# Patient Record
Sex: Male | Born: 1995 | Race: Black or African American | Hispanic: No | Marital: Single | Smoking: Never smoker
Health system: Southern US, Community
[De-identification: ages and names within clinical notes are randomized; demographics above are authoritative.]

## PROBLEM LIST (undated history)

## (undated) DIAGNOSIS — A6000 Herpesviral infection of urogenital system, unspecified: Secondary | ICD-10-CM

## (undated) DIAGNOSIS — Z9109 Other allergy status, other than to drugs and biological substances: Secondary | ICD-10-CM

## (undated) DIAGNOSIS — F988 Other specified behavioral and emotional disorders with onset usually occurring in childhood and adolescence: Secondary | ICD-10-CM

## (undated) DIAGNOSIS — I480 Paroxysmal atrial fibrillation: Secondary | ICD-10-CM

## (undated) HISTORY — DX: Other specified behavioral and emotional disorders with onset usually occurring in childhood and adolescence: F98.8

## (undated) HISTORY — DX: Paroxysmal atrial fibrillation: I48.0

## (undated) HISTORY — DX: Other allergy status, other than to drugs and biological substances: Z91.09

## (undated) HISTORY — DX: Herpesviral infection of urogenital system, unspecified: A60.00

---
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
## 2015-01-31 ENCOUNTER — Encounter: Payer: Self-pay | Admitting: Family Medicine

## 2015-01-31 ENCOUNTER — Ambulatory Visit (INDEPENDENT_AMBULATORY_CARE_PROVIDER_SITE_OTHER): Payer: Medicaid Other | Admitting: Family Medicine

## 2015-01-31 VITALS — BP 118/67 | HR 70 | Temp 97.9°F | Ht 72.5 in | Wt 142.0 lb

## 2015-01-31 DIAGNOSIS — L739 Follicular disorder, unspecified: Secondary | ICD-10-CM

## 2015-01-31 DIAGNOSIS — N4889 Other specified disorders of penis: Secondary | ICD-10-CM

## 2015-01-31 DIAGNOSIS — Z0000 Encounter for general adult medical examination without abnormal findings: Secondary | ICD-10-CM | POA: Insufficient documentation

## 2015-01-31 DIAGNOSIS — N489 Disorder of penis, unspecified: Secondary | ICD-10-CM

## 2015-01-31 NOTE — Assessment & Plan Note (Signed)
Patient is up to date on vaccines.  Will need TDaP in 2 years.  Declines STD testing currently  Dicussed exercise (plays basketball regularly), eating healthy, avoiding EtOH, tobacco, and drugs, and safe sex. F/u in 1 year or sooner as needed.

## 2015-01-31 NOTE — Progress Notes (Signed)
Patient ID: Aaron Lutz, male   DOB: 11/28/1995, 19 y.o.   MRN: [id number]    CC: annual exam HPI: Patient is a 19 y.o. male with a past medical history of murmur, allergic rhinitis presenting to clinic today for a physical exam.  Acute Concerns: Occasional dry rash noted in the pubic hair x 2 months. Flesh colored small bumps. No pain or drainage at the site. Occasionally pruritic. No fevers. No dysuria or penile discharge.  per the patient's report, he denies any lesions of the penile shaft or glans Has tried alcohol and neosporin with occasional improvement.  Had unprotected 5 months ago. Has only had 1 partner in the past. No concerns for STDs. Per his report, has only had 1 sexual partner in the past.      Past Medical History Patient Active Problem List   Diagnosis Date Noted  . Health care maintenance 01/31/2015  . Systolic murmur 08/27/2014  . Palpitations 07/27/2014  . Lesion of penis 07/27/2014  . Risky sexual behavior 06/09/2013  . Allergic rhinitis 05/17/2013  . Skin irritation 01/08/2012    Medications- reviewed and updated Current Outpatient Prescriptions  Medication Sig Dispense Refill  . cetirizine (ZYRTEC) 10 MG tablet Take 1 tablet (10 mg total) by mouth daily as needed for allergies. 30 tablet 2  . fluticasone (FLONASE) 50 MCG/ACT nasal spray Place 2 sprays into both nostrils daily. (Patient not taking: Reported on 01/31/2015) 16 g 2  . PATADAY 0.2 % SOLN One drop to each eye daily. (Patient not taking: Reported on 01/31/2015) 2.5 mL 2   No current facility-administered medications for this visit.    Immunization:  Tdap/TD: 2008   Influenza: 02/2015  Cancer Screening:  Colonoscopy: N/A    Social:  History   Social History  . Marital Status: Single    Spouse Name: N/A  . Number of Children: N/A  . Years of Education: N/A   Social History Main Topics  . Smoking status: Never Smoker   . Smokeless tobacco: Not on file  . Alcohol Use: No  . Drug Use: No    . Sexual Activity: Yes   Other Topics Concern  . None   Social History Narrative   Patient states he is only been sexually active with one person that was approximately 5 months ago, however from reviewing the EMR it appears that he was exposed to STDs back in 2012. 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 Hypertension Maternal Grandmother   . Diabetes Maternal Grandmother     ROS: All other systems reviewed and are negative.  Office vital signs reviewed. BP 118/67 mmHg  Pulse 70  Temp(Src) 97.9 F (36.6 C) (Oral)  Ht 6' 0.5" (1.842 m)  Wt 142 lb (64.411 kg)  BMI 18.98 kg/m2   Physical Examination:  General: Awake, alert, well- nourished, NAD ENMT:  TMs intact, normal light reflex, no erythema, no bulging. Nasal turbinates moist. MMM, Oropharynx clear without erythema or tonsillar exudate/hypertrophy Eyes: Conjunctiva non-injected. PERRL.  Cardio:  RRR, no m/r/g noted.  Pulm: No increased WOB.  CTAB, without wheezes, rhonchi or crackles noted.  GI: soft, NT/ND,+BS x4, no hepatomegaly, no splenomegaly GU: performed with male chaperone. No papules, lesions, or erythema noted. Pt points to hair follicle however still no lesions were noted with light.   Patient did not want me to examine his penis, but denies any lesions, drainage. MSK: Normal bulk and tone. Normal gait and station Skin: dry, intact, no rashes or lesions other than above. Neuro: Strength and sensation grossly intact, DTRs 1/4  Assessment/Plan:  Lesion of penis Was not able to examine the penile  shaft/glans. Per the patient, no lesions currently. If new concerns arise, will refer back to urology given concerns for Bowmen's disease on previous evaluation.  Health care maintenance Patient is up to date on vaccines.  Will need TDaP in 2 years.  Declines STD testing currently  Dicussed exercise (plays basketball regularly), eating healthy, avoiding EtOH, tobacco, and drugs, and safe sex. F/u in 1 year or sooner as needed.     No orders of the defined types were placed in this encounter.    No orders of the defined types were placed in this encounter.    Joanna Puffrystal S. Dorsey PGY-2, [facility]

## 2015-01-31 NOTE — Assessment & Plan Note (Addendum)
Was not able to examine the penile shaft/glans. Per the patient, no lesions currently. If new concerns arise, will refer back to urology given concerns for Bowmen's disease on previous evaluation.

## 2015-01-31 NOTE — Patient Instructions (Addendum)
Preventive Care for Adults A healthy lifestyle and preventive care can promote health and wellness. Preventive health guidelines for men include the following key practices:  A routine yearly physical is a good way to check with your health care provider about your health and preventative screening. It is a chance to share any concerns and updates on your health and to receive a thorough exam.  Visit your dentist for a routine exam and preventative care every 6 months. Brush your teeth twice a day and floss once a day. Good oral hygiene prevents tooth decay and gum disease.  The frequency of eye exams is based on your age, health, family medical history, use of contact lenses, and other factors. Follow your health care provider's recommendations for frequency of eye exams.  Eat a healthy diet. Foods such as vegetables, fruits, whole grains, low-fat dairy products, and lean protein foods contain the nutrients you need without too many calories. Decrease your intake of foods high in solid fats, added sugars, and salt. Eat the right amount of calories for you.Get information about a proper diet from your health care provider, if necessary.  Regular physical exercise is one of the most important things you can do for your health. Most adults should get at least 150 minutes of moderate-intensity exercise (any activity that increases your heart rate and causes you to sweat) each week. In addition, most adults need muscle-strengthening exercises on 2 or more days a week.  Maintain a healthy weight. The body mass index (BMI) is a screening tool to identify possible weight problems. It provides an estimate of body fat based on height and weight. Your health care provider can find your BMI and can help you achieve or maintain a healthy weight.For adults 20 years and older:  A BMI below 18.5 is considered underweight.  A BMI of 18.5 to 24.9 is normal.  A BMI of 25 to 29.9 is considered overweight.  A  BMI of 30 and above is considered obese.  Maintain normal blood lipids and cholesterol levels by exercising and minimizing your intake of saturated fat. Eat a balanced diet with plenty of fruit and vegetables. Blood tests for lipids and cholesterol should begin at age 26 and be repeated every 5 years. If your lipid or cholesterol levels are high, you are over 50, or you are at high risk for heart disease, you may need your cholesterol levels checked more frequently.Ongoing high lipid and cholesterol levels should be treated with medicines if diet and exercise are not working.  Avoid use of street drugs. Do not share needles with anyone. Ask for help if you need support or instructions about stopping the use of drugs.  High blood pressure causes heart disease and increases the risk of stroke. Your blood pressure should be checked at least every 1-2 years. Ongoing high blood pressure should be treated with medicines, if weight loss and exercise are not effective.  Practice safe sex. Use condoms and avoid high-risk sexual practices to reduce the spread of sexually transmitted infections (STIs). STIs include gonorrhea, chlamydia, syphilis, trichomonas, herpes, HPV, and human immunodeficiency virus (HIV). Herpes, HIV, and HPV are viral illnesses that have no cure. They can result in disability, cancer, and death.  If you are at risk of being infected with HIV, it is recommended that you take a prescription medicine daily to prevent HIV infection. This is called preexposure prophylaxis (PrEP). You are considered at risk if:  You are a man who has sex with  other men (MSM) and have other risk factors.  You are a heterosexual man, are sexually active, and are at increased risk for HIV infection.  You take drugs by injection.  You are sexually active with a partner who has HIV.  Talk with your health care provider about whether you are at high risk of being infected with HIV. If you choose to begin PrEP,  you should first be tested for HIV. You should then be tested every 3 months for as long as you are taking PrEP.  Testicular cancer screening is not recommended for adult males who have no symptoms. Screening includes self-exam, a health care provider exam, and other screening tests. Consult with your health care provider about any symptoms you have or any concerns you have about testicular cancer.  Use sunscreen. Apply sunscreen liberally and repeatedly throughout the day. You should seek shade when your shadow is shorter than you. Protect yourself by wearing long sleeves, pants, a wide-brimmed hat, and sunglasses year round, whenever you are outdoors.  Once a month, do a whole-body skin exam, using a mirror to look at the skin on your back. Tell your health care provider about new moles, moles that have irregular borders, moles that are larger than a pencil eraser, or moles that have changed in shape or color.  Stay current with required vaccines (immunizations).  Influenza vaccine. All adults should be immunized every year.  Tetanus, diphtheria, and acellular pertussis (Td, Tdap) vaccine. An adult who has not previously received Tdap or who does not know his vaccine status should receive 1 dose of Tdap. This initial dose should be followed by tetanus and diphtheria toxoids (Td) booster doses every 10 years. Adults with an unknown or incomplete history of completing a 3-dose immunization series with Td-containing vaccines should begin or complete a primary immunization series including a Tdap dose. Adults should receive a Td booster every 10 years.  Varicella vaccine. An adult without evidence of immunity to varicella should receive 2 doses or a second dose if he has previously received 1 dose.  Human papillomavirus (HPV) vaccine. Males aged 64-21 years who have not received the vaccine previously should receive the 3-dose series. Males aged 22-26 years may be immunized. Immunization is recommended  through the age of 14 years for any male who has sex with males and did not get any or all doses earlier. Immunization is recommended for any person with an immunocompromised condition through the age of 39 years if he did not get any or all doses earlier. During the 3-dose series, the second dose should be obtained 4-8 weeks after the first dose. The third dose should be obtained 24 weeks after the first dose and 16 weeks after the second dose.  Zoster vaccine. One dose is recommended for adults aged 72 years or older unless certain conditions are present.  Measles, mumps, and rubella (MMR) vaccine. Adults born before [birth year] generally are considered immune to measles and mumps. Adults born in [birth year] or later should have 1 or more doses of MMR vaccine unless there is a contraindication to the vaccine or there is laboratory evidence of immunity to each of the three diseases. A routine second dose of MMR vaccine should be obtained at least 28 days after the first dose for students attending postsecondary schools, health care workers, or international travelers. People who received inactivated measles vaccine or an unknown type of measles vaccine during 1963-1967 should receive 2 doses of MMR vaccine. People who received inactivated mumps  vaccine or an unknown type of mumps vaccine before 1979 and are at high risk for mumps infection should consider immunization with 2 doses of MMR vaccine. Unvaccinated health care workers born before [birth year] who lack laboratory evidence of measles, mumps, or rubella immunity or laboratory confirmation of disease should consider measles and mumps immunization with 2 doses of MMR vaccine or rubella immunization with 1 dose of MMR vaccine.  Pneumococcal 13-valent conjugate (PCV13) vaccine. When indicated, a person who is uncertain of his immunization history and has no record of immunization should receive the PCV13 vaccine. An adult aged 21 years or older who has certain medical  conditions and has not been previously immunized should receive 1 dose of PCV13 vaccine. This PCV13 should be followed with a dose of pneumococcal polysaccharide (PPSV23) vaccine. The PPSV23 vaccine dose should be obtained at least 8 weeks after the dose of PCV13 vaccine. An adult aged 78 years or older who has certain medical conditions and previously received 1 or more doses of PPSV23 vaccine should receive 1 dose of PCV13. The PCV13 vaccine dose should be obtained 1 or more years after the last PPSV23 vaccine dose.  Pneumococcal polysaccharide (PPSV23) vaccine. When PCV13 is also indicated, PCV13 should be obtained first. All adults aged 65 years and older should be immunized. An adult younger than age 52 years who has certain medical conditions should be immunized. Any person who resides in a nursing home or long-term care facility should be immunized. An adult smoker should be immunized. People with an immunocompromised condition and certain other conditions should receive both PCV13 and PPSV23 vaccines. People with human immunodeficiency virus (HIV) infection should be immunized as soon as possible after diagnosis. Immunization during chemotherapy or radiation therapy should be avoided. Routine use of PPSV23 vaccine is not recommended for American Indians, [place] Natives, or people younger than 65 years unless there are medical conditions that require PPSV23 vaccine. When indicated, people who have unknown immunization and have no record of immunization should receive PPSV23 vaccine. One-time revaccination 5 years after the first dose of PPSV23 is recommended for people aged 19-64 years who have chronic kidney failure, nephrotic syndrome, asplenia, or immunocompromised conditions. People who received 1-2 doses of PPSV23 before age 71 years should receive another dose of PPSV23 vaccine at age 28 years or later if at least 5 years have passed since the previous dose. Doses of PPSV23 are not needed for people  immunized with PPSV23 at or after age 13 years.  Meningococcal vaccine. Adults with asplenia or persistent complement component deficiencies should receive 2 doses of quadrivalent meningococcal conjugate (MenACWY-D) vaccine. The doses should be obtained at least 2 months apart. Microbiologists working with certain meningococcal bacteria, [employer] recruits, people at risk during an outbreak, and people who travel to or live in countries with a high rate of meningitis should be immunized. A first-year college student up through age 75 years who is living in a residence hall should receive a dose if he did not receive a dose on or after his 16th birthday. Adults who have certain high-risk conditions should receive one or more doses of vaccine.  Hepatitis A vaccine. Adults who wish to be protected from this disease, have certain high-risk conditions, work with hepatitis A-infected animals, work in hepatitis A research labs, or travel to or work in countries with a high rate of hepatitis A should be immunized. Adults who were previously unvaccinated and who anticipate close contact with an international adoptee during the first 70  days after arrival in the [place] from a country with a high rate of hepatitis A should be immunized.  Hepatitis B vaccine. Adults should be immunized if they wish to be protected from this disease, have certain high-risk conditions, may be exposed to blood or other infectious body fluids, are household contacts or sex partners of hepatitis B positive people, are clients or workers in certain care facilities, or travel to or work in countries with a high rate of hepatitis B.  Haemophilus influenzae type b (Hib) vaccine. A previously unvaccinated person with asplenia or sickle cell disease or having a scheduled splenectomy should receive 1 dose of Hib vaccine. Regardless of previous immunization, a recipient of a hematopoietic stem cell transplant should receive a 3-dose series  6-12 months after his successful transplant. Hib vaccine is not recommended for adults with HIV infection. Preventive Service / Frequency Ages 54 to 78  Blood pressure check.** / Every 1 to 2 years.  Lipid and cholesterol check.** / Every 5 years beginning at age 87.  Skin self-exam. / Monthly.  Influenza vaccine. / Every year.  Tetanus, diphtheria, and acellular pertussis (Tdap, Td) vaccine.** / Consult your health care provider. 1 dose of Td every 10 years.  Varicella vaccine.** / Consult your health care provider.  HPV vaccine. / 3 doses over 6 months, if 33 or younger.  Measles, mumps, rubella (MMR) vaccine.** / You need at least 1 dose of MMR if you were born in 1957 or later. You may also need a second dose.  Pneumococcal 13-valent conjugate (PCV13) vaccine.** / Consult your health care provider.  Pneumococcal polysaccharide (PPSV23) vaccine.** / 1 to 2 doses if you smoke cigarettes or if you have certain conditions.  Meningococcal vaccine.** / 1 dose if you are age 4 to 69 years and a Market researcher living in a residence hall, or have one of several medical conditions. You may also need additional booster doses.  Hepatitis A vaccine.** / Consult your health care provider.  Hepatitis B vaccine.** / Consult your health care provider.  Haemophilus influenzae type b (Hib) vaccine.** / Consult your health care provider.

## 2015-10-08 ENCOUNTER — Ambulatory Visit (INDEPENDENT_AMBULATORY_CARE_PROVIDER_SITE_OTHER): Payer: Medicaid Other | Admitting: Family Medicine

## 2015-10-08 VITALS — BP 122/70 | HR 88 | Temp 98.7°F | Wt 140.8 lb

## 2015-10-08 DIAGNOSIS — Z23 Encounter for immunization: Secondary | ICD-10-CM | POA: Diagnosis not present

## 2015-10-08 DIAGNOSIS — J358 Other chronic diseases of tonsils and adenoids: Secondary | ICD-10-CM | POA: Diagnosis not present

## 2015-10-08 NOTE — Assessment & Plan Note (Signed)
Consistent with bilateral extensive tonsilloliths, no sign of complication. Tonsillars appear normal otherwise.  Plan: 1. Reassurance, benign condition, may cause halitosis (does not seem to be his concern) 2. Per patient request attempted manual dislodge with long q-tips, unsuccessful, most stones seem to be deeply inside tonsillar crypts 3. Offered referral to ENT for evaluation, consider future tonsillectomy. Patient not interested today (again I advised against this but presented it as a future option).

## 2015-10-08 NOTE — Patient Instructions (Signed)
Thank you for coming in to clinic today.  1. You are exactly right, these are Tonsilloliths, you have several on both tonsils. There are no other abnormalities with your throat. - These are benign, not a serious problem but can be uncomfortable or a nuisance - Recommend continue with warm salt water gargles to loosen them up, you may try the long q-tips to help dislodge them later - if they keep coming back, cause bad breath, or cause tonsil swelling or recurrent sore throat/infections, then I would recommend at least discussing with an ENT doctor to see what options you have. They may recommend Tonsillectomy surgery in future, if you choose. But there is not a medical reason to do this surgery.  Please schedule a follow-up appointment with Dr Leonides Schanzorsey as needed  If you have any other questions or concerns, please feel free to call the clinic to contact me. You may also schedule an earlier appointment if necessary.  However, if your symptoms get significantly worse, please go to the Emergency Department to seek immediate medical attention.  Saralyn PilarAlexander Brigitt Mcclish, DO [facility]

## 2015-10-08 NOTE — Progress Notes (Signed)
   Subjective:    Patient ID: Aaron Lutz, male    DOB: 04/04/1996, 20 y.o.   MRN: [id number]  Aaron ChestnutJuwan Lutz is a 20 y.o. male presenting on 10/08/2015 for tonsil stones   Patient presents for a same day appointment.   HPI   TONSILLOLITHS, CHRONIC: - Reported a long history of this problem with "tonsil stones", but just realized what it was recently. Stated he had a discomfort in back of his throat more Right > Left about 5 days ago, he found what it was online. He tried salt water gargles with some relief and tried to dislodge some without success. No other treatments tried. In years past, he has noticed that he occasionally will cough up small "white pieces", and thought it was old food. - Admits to prior history of pharyngitis about 1-2x yearly, none recently - Denies any fevers/chills, sore throat, swelling, cough, bad breath, nausea, vomiting  Social History  Substance Use Topics  . Smoking status: Never Smoker   . Smokeless tobacco: Not on file  . Alcohol Use: No    Review of Systems Per HPI unless specifically indicated above     Objective:    BP 122/70 mmHg  Pulse 88  Temp(Src) 98.7 F (37.1 C) (Oral)  Wt 140 lb 12.8 oz (63.866 kg)  Wt Readings from Last 3 Encounters:  10/08/15 140 lb 12.8 oz (63.866 kg) (26 %*, Z = -0.63)  01/31/15 142 lb (64.411 kg) (32 %*, Z = -0.48)  12/19/14 146 lb (66.225 kg) (39 %*, Z = -0.27)   * Growth percentiles are based on CDC 2-20 Years data.    Physical Exam  Constitutional: He appears well-developed and well-nourished. No distress.  Well-appearing, comfortable, cooperative  HENT:  Oropharynx with clear and moist mucus mem. Significant with Bilateral Tonsils having several scattered firm white tonsilloliths, seem deeply embedded in crypts. Unable to dislodge with attempted q-tip. No tonsillar edema or erythema. No exudates. No ulcerations or other mucosal abnormalities.  Neck: Normal range of motion. Neck supple.  Lymphadenopathy:   He has no cervical adenopathy.  Neurological: He is alert.  Skin: Skin is warm and dry. He is not diaphoretic.  Nursing note and vitals reviewed.      Assessment & Plan:   Problem List Items Addressed This Visit    Tonsillolith - Primary    Consistent with bilateral extensive tonsilloliths, no sign of complication. Tonsillars appear normal otherwise.  Plan: 1. Reassurance, benign condition, may cause halitosis (does not seem to be his concern) 2. Per patient request attempted manual dislodge with long q-tips, unsuccessful, most stones seem to be deeply inside tonsillar crypts 3. Offered referral to ENT for evaluation, consider future tonsillectomy. Patient not interested today (again I advised against this but presented it as a future option).       Other Visit Diagnoses    Encounter for immunization        Relevant Orders    Flu Vaccine QUAD 36+ mos IM       No orders of the defined types were placed in this encounter.      Follow up plan: Return in about 3 months (around 01/08/2016) for Annual physical.  Saralyn PilarAlexander Kishan Wachsmuth, DO [facility], PGY-3

## 2015-10-09 ENCOUNTER — Encounter: Payer: Self-pay | Admitting: Family Medicine

## 2016-05-09 ENCOUNTER — Emergency Department (HOSPITAL_COMMUNITY)
Admission: EM | Admit: 2016-05-09 | Discharge: 2016-05-09 | Disposition: A | Discharge status: Home / Self Care | Payer: Medicaid Other | Attending: Emergency Medicine | Admitting: Emergency Medicine

## 2016-05-09 ENCOUNTER — Emergency Department (HOSPITAL_COMMUNITY): Payer: Medicaid Other

## 2016-05-09 ENCOUNTER — Encounter (HOSPITAL_COMMUNITY): Payer: Self-pay | Admitting: *Deleted

## 2016-05-09 DIAGNOSIS — I493 Ventricular premature depolarization: Secondary | ICD-10-CM

## 2016-05-09 DIAGNOSIS — R002 Palpitations: Secondary | ICD-10-CM

## 2016-05-09 DIAGNOSIS — Z79899 Other long term (current) drug therapy: Secondary | ICD-10-CM | POA: Diagnosis not present

## 2016-05-09 DIAGNOSIS — E876 Hypokalemia: Secondary | ICD-10-CM

## 2016-05-09 DIAGNOSIS — M419 Scoliosis, unspecified: Secondary | ICD-10-CM | POA: Diagnosis not present

## 2016-05-09 DIAGNOSIS — R0602 Shortness of breath: Secondary | ICD-10-CM

## 2016-05-09 DIAGNOSIS — R0789 Other chest pain: Secondary | ICD-10-CM

## 2016-05-09 LAB — CBC
HEMATOCRIT: 44.4 % (ref 39.0–52.0)
Hemoglobin: 15.5 g/dL (ref 13.0–17.0)
MCH: 29.9 pg (ref 26.0–34.0)
MCHC: 34.9 g/dL (ref 30.0–36.0)
MCV: 85.5 fL (ref 78.0–100.0)
PLATELETS: 387 10*3/uL (ref 150–400)
RBC: 5.19 MIL/uL (ref 4.22–5.81)
RDW: 12.1 % (ref 11.5–15.5)
WBC: 6 10*3/uL (ref 4.0–10.5)

## 2016-05-09 LAB — RAPID URINE DRUG SCREEN, HOSP PERFORMED
AMPHETAMINES: NOT DETECTED
BENZODIAZEPINES: NOT DETECTED
Barbiturates: NOT DETECTED
COCAINE: NOT DETECTED
OPIATES: NOT DETECTED
Tetrahydrocannabinol: NOT DETECTED

## 2016-05-09 LAB — BASIC METABOLIC PANEL
Anion gap: 11 (ref 5–15)
BUN: 7 mg/dL (ref 6–20)
CHLORIDE: 104 mmol/L (ref 101–111)
CO2: 24 mmol/L (ref 22–32)
CREATININE: 0.93 mg/dL (ref 0.61–1.24)
Calcium: 9.5 mg/dL (ref 8.9–10.3)
GFR calc non Af Amer: >60 mL/min (ref >60)
Glucose, Bld: 106 mg/dL — ABNORMAL HIGH (ref 65–99)
POTASSIUM: 3.3 mmol/L — AB (ref 3.5–5.1)
SODIUM: 139 mmol/L (ref 135–145)

## 2016-05-09 LAB — I-STAT TROPONIN, ED: Troponin i, poc: 0 ng/mL (ref 0.00–0.08)

## 2016-05-09 LAB — TSH: TSH: 1.919 u[IU]/mL (ref 0.350–4.500)

## 2016-05-09 LAB — MAGNESIUM: MAGNESIUM: 2.1 mg/dL (ref 1.7–2.4)

## 2016-05-09 MED ORDER — SODIUM CHLORIDE 0.9 % IV BOLUS (SEPSIS)
1000.0000 mL | Freq: Once | INTRAVENOUS | Status: AC
  Administered 2016-05-09: 1000 mL via INTRAVENOUS

## 2016-05-09 MED ORDER — POTASSIUM CHLORIDE CRYS ER 20 MEQ PO TBCR
40.0000 meq | EXTENDED_RELEASE_TABLET | Freq: Once | ORAL | Status: AC
  Administered 2016-05-09: 40 meq via ORAL
  Filled 2016-05-09: qty 2

## 2016-05-09 NOTE — Discharge Instructions (Signed)
Your palpitations are likely related to Premature Ventricular Contractions (PVCs). Your labs showed a mildly low potassium level, which was replenished here; see the list of foods below to help supplement your diet with potassium. Stay well hydrated. Avoid caffeine or energy drink intake, avoid over the counter decongestants such as Sudafed, etc. Your chest xray showed mild scoliosis of your spine, which is not a emergently concerning finding, you can follow up with your primary care doctor as needed for ongoing management of this finding. All your other labs were unremarkable and reassuring. Follow up with your cardiologist in the next 5-7 days for ongoing evaluation and management of your palpitations and symptoms. Return to the ER for changes or worsening symptoms.

## 2016-05-09 NOTE — ED Triage Notes (Signed)
Pt to ED by EMS c/o heart racing. Pt got up to get ready for work and felt like his heart was racing. Pt drove to his girlfriends house and felt L sided chest pain. EMS initial HR 130-170 in afib rvr. Enroute pt converted without intervention

## 2016-05-09 NOTE — ED Provider Notes (Signed)
[facility]EMER[facility]ENCY DEPT Provider Note   CSN: [id number] Arrival date & time: 05/09/16  4098     History   Chief Complaint Chief Complaint  Patient presents with  . Palpitations    HPI Aaron Lutz is a 20 y.o. male with a PMHx of palpitations, systolic murmur, and allergies, who presents to the ED with complaints of L chest pain and palpitations that began upon awakening around 5 AM. Patient states that he woke up and had some mild left-sided chest pressure and as he was getting ready he felt like his heart was "thumping hard and then skipping a beat, and then thumping hard again". It then became more of a fast constant heart beat, and caused the chest pressure to worsen, and he felt somewhat SOB. He called EMS and while awaiting transport, he prayed with his family; when EMS got there they advised him to sit back and try to "relax", and at that point his heart rate sporadically converted. Describes the chest pressure as 5/10 constant pressure-like pain across the left side of his chest, nonradiating, worse with irregular heartbeat, unchanged with exertion or inspiration, and improved with rest but no other treatments tried prior to arrival, and has completely resolved at this time. SOB also resolved after his heart rate returned to normal. EMS rhythm strip showed ?Afib with RVR and PVCs (see Haiku image below for strip). He states that the entire episode lasted ~1hr before self resolving. Arrived to ED in NSR.  He was seen by Dr. Tobias Alexander of [facility] last February 2016 for concerns of palpitations, had a normal echo aside from mild tricuspid regurg, and had a normal Holter monitor study done, and he has not followed back up with her again.  He states that last week he had some cold and flu symptoms, was taking Robitussin and NyQuil up until Wednesday night (3 days ago), but denies any other decongestant use. Denies any energy drinks or caffeine intake recently. Denies any illicit drug use. He is a  nonsmoker with no family history of cardiac disease. He denies any fevers, chills, ongoing chest pain or shortness of breath, lightheadedness, diaphoresis, leg swelling, recent travel/surgery/immobilization, family or personal history of DVT/PE, cough, abdominal pain, nausea, vomiting, diarrhea, constipation, dysuria, hematuria, numbness, tingling, focal weakness, claudication, orthopnea. Denies any other complaints at this time. All symptoms have resolved prior to arrival.   The history is provided by the patient and medical records. No language interpreter was used.  Palpitations   This is a recurrent problem. The current episode started 1 to 2 hours ago. The problem occurs constantly. The problem has been resolved. The problem is associated with an unknown factor. On average, each episode lasts 1 hour. Associated symptoms include chest pain (initially with palpitations but resolved), chest pressure, irregular heartbeat and shortness of breath (initially with palpitations, but resolved). Pertinent negatives include no diaphoresis, no fever, no numbness, no claudication, no near-syncope, no orthopnea, no PND, no abdominal pain, no nausea, no vomiting, no lower extremity edema, no weakness and no cough. He has tried deep relaxation for the symptoms. The treatment provided significant relief. Risk factors include being male. 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    Past Medical History:  Diagnosis Date  . Genital herpes   . Nail biting   . Pollen allergies     Patient Active Problem List   Diagnosis Date Noted  . Tonsillolith 10/08/2015  . Health care maintenance 01/31/2015  .  Systolic murmur 08/27/2014  . Palpitations 07/27/2014  . Lesion of penis 07/27/2014  . Risky sexual behavior 06/09/2013  . Allergic rhinitis 05/17/2013  . Skin irritation 01/08/2012    History reviewed. No pertinent surgical history.     Home  Medications    Prior to Admission medications   Medication Sig Start Date End Date Taking? Authorizing Provider  cetirizine (ZYRTEC) 10 MG tablet Take 1 tablet (10 mg total) by mouth daily as needed for allergies. 12/19/14   Latrelle DodrillBrittany J McIntyre, MD  fluticasone (FLONASE) 50 MCG/ACT nasal spray Place 2 sprays into both nostrils daily. Patient not taking: Reported on 01/31/2015 12/19/14   Latrelle DodrillBrittany J McIntyre, MD  PATADAY 0.2 % SOLN One drop to each eye daily. Patient not taking: Reported on 01/31/2015 12/19/14   Latrelle DodrillBrittany J McIntyre, MD    Family History Family History  Problem Relation Age of Onset  . Hypertension Maternal Grandmother   . Diabetes Maternal Grandmother     Social History Social History  Substance Use Topics  . Smoking status: Never Smoker  . Smokeless tobacco: Never Used  . Alcohol use No     Allergies   Pollen extract   Review of Systems Review of Systems  Constitutional: Negative for chills, diaphoresis and fever.  Respiratory: Positive for shortness of breath (initially with palpitations, but resolved). Negative for cough.   Cardiovascular: Positive for chest pain (initially with palpitations but resolved) and palpitations. Negative for orthopnea, claudication, leg swelling, PND and near-syncope.  Gastrointestinal: Negative for abdominal pain, constipation, diarrhea, nausea and vomiting.  Genitourinary: Negative for dysuria and hematuria.  Musculoskeletal: Negative for arthralgias and myalgias.  Skin: Negative for color change.  Allergic/Immunologic: Negative for immunocompromised state.  Neurological: Negative for weakness, light-headedness and numbness.  Psychiatric/Behavioral: Negative for confusion.   10 Systems reviewed and are negative for acute change except as noted in the HPI.   Physical Exam Updated Vital Signs BP 128/68   Pulse 84   Temp 97.9 F (36.6 C) (Oral)   Resp 18   SpO2 100%   Physical Exam  Constitutional: He is oriented to person,  place, and time. Vital signs are normal. He appears well-developed and well-nourished.  Non-toxic appearance. No distress.  Afebrile, nontoxic, NAD  HENT:  Head: Normocephalic and atraumatic.  Mouth/Throat: Oropharynx is clear and moist and mucous membranes are normal.  Eyes: Conjunctivae and EOM are normal. Right eye exhibits no discharge. Left eye exhibits no discharge.  Neck: Normal range of motion. Neck supple.  Cardiovascular: Normal rate, regular rhythm, normal heart sounds and intact distal pulses.  Exam reveals no gallop and no friction rub.   No murmur heard. RRR, nl s1/s2, no m/r/g, distal pulses intact, no pedal edema  Pulmonary/Chest: Effort normal and breath sounds normal. No respiratory distress. He has no decreased breath sounds. He has no wheezes. He has no rhonchi. He has no rales. He exhibits no tenderness, no crepitus, no deformity and no retraction.  CTAB in all lung fields, no w/r/r, no hypoxia or increased WOB, speaking in full sentences, SpO2 100% on RA  Chest wall nonTTP without crepitus, deformities, or retractions   Abdominal: Soft. Normal appearance and bowel sounds are normal. He exhibits no distension. There is no tenderness. There is no rigidity, no rebound, no guarding, no CVA tenderness, no tenderness at McBurney's point and negative Murphy's sign.  Musculoskeletal: Normal range of motion.  MAE x4 Strength and sensation grossly intact Distal pulses intact Gait steady No pedal edema, neg homan's  bilaterally   Neurological: He is alert and oriented to person, place, and time. He has normal strength. No sensory deficit.  Skin: Skin is warm, dry and intact. No rash noted.  Psychiatric: He has a normal mood and affect.  Nursing note and vitals reviewed.    ED Treatments / Results  Labs (all labs ordered are listed, but only abnormal results are displayed) Labs Reviewed  BASIC METABOLIC PANEL - Abnormal; Notable for the following:       Result Value    Potassium 3.3 (*)    Glucose, Bld 106 (*)    All other components within normal limits  CBC  MAGNESIUM  TSH  RAPID URINE DRUG SCREEN, HOSP PERFORMED  I-STAT TROPOININ, ED    EKG  EKG Interpretation  Date/Time:  Saturday May 09 2016 06:26:45 EDT Ventricular Rate:  83 PR Interval:    QRS Duration: 108 QT Interval:  359 QTC Calculation: 422 R Axis:   58 Text Interpretation:  Sinus rhythm Probable left atrial enlargement RSR' in V1 or V2, probably normal variant Minimal ST depression ST elev, probable normal early repol pattern No significant change since last tracing since 2016 Confirmed by [place],  DO, KRISTEN [phone]) on 05/09/2016 6:31:04 AM         Radiology Dg Chest 2 View  Result Date: 05/09/2016 CLINICAL DATA:  20 year old male with left upper chest tightness since early this morning. Heart racing. Shaking. Initial encounter. EXAM: CHEST  2 VIEW COMPARISON:  No comparison chest x-ray. Comparison the AP thoracic plain film exam 03/02/2008. FINDINGS: No infiltrate, congestive heart failure or pneumothorax. S shaped scoliosis thoracic and upper lumbar spine. Slight distortion of the mediastinal and cardiac silhouette which are within normal limits. IMPRESSION: Lungs are clear. S shaped scoliosis thoracic and upper lumbar spine. Slight distortion of the mediastinal and cardiac silhouette which are within normal limits. Electronically Signed   By: Lacy Duverney M.D.   On: 05/09/2016 08:10    Echo 08/2014 Study Conclusions: - Left ventricle: The cavity size was normal. Wall thickness was normal. Systolic function was vigorous. The estimated ejection fraction was in the range of 65% to 70%. Wall motion was normal; there were no regional wall motion abnormalities. Left ventricular diastolic function parameters were normal. - Left atrium: The atrium was normal in size. Impressions: - Normal study.  HOLTER MONITOR 08/2014: no arrhythmias  found   Procedures Procedures (including critical care time)  Medications Ordered in ED Medications  sodium chloride 0.9 % bolus 1,000 mL (0 mLs Intravenous Stopped 05/09/16 0846)  potassium chloride SA (K-DUR,KLOR-CON) CR tablet 40 mEq (40 mEq Oral Given 05/09/16 0740)     Initial Impression / Assessment and Plan / ED Course  I have reviewed the triage vital signs and the nursing notes.  Pertinent labs & imaging results that were available during my care of the patient were reviewed by me and considered in my medical decision making (see chart for details).  Clinical Course    20 y.o. male here with palpitations that occurred this morning after awakening, started out with CP, had some SOB initially when his heart was "racing" but that resolved entirely once his heart rate came down. Entire episode lasted about 1hr, and self-converted. Rhythm strip from EMS has ?Afib although some P waves are seen within the strip so difficult to determine if this is truly afib vs just rapid rate and PVCs. Arrives here in NSR and CP/SOB free. No longer having any symptoms. No reproducible CP  on exam. No tachycardia/hypoxia/LE swelling, and no RFs for PE, doubt this as a cause. Had a cold last week, but hasn't taken any meds for this since 3 days ago. No illicit drug use or caffeine/energy drink use. Has seen cardiology last year for palpitations, Echo nl and holter monitor without arrhythmias. EKG here unchanged from prior and without any acute ischemic findings. Will get CBC, BMP, trop, UDS, TSH, Mg, and CXR. Will give fluids and then reassess shortly. Doubt need for ASA or other pain meds given that symptoms have resolved. Will reassess shortly.   7:27 AM Trop WNL. CBC WNL. BMP with mildly low K 3.3, will replete here. Still awaiting TSH, Mg, UDS, and CXR. Will reassess shortly   8:48 AM TSH WNL. Mg WNL. UDS negative. CXR showing mild scoliosis but otherwise unremarkable. Pt feeling well, no ongoing  palpitations or recurrence of symptoms. Discussed that it seems likely that his symptoms were related to PVCs, I doubt Afib truly occurred but given the appearance of the rhythm strip from EMS, it's still possible. CHA2DS2-VASC score 0, doubt need for anticoagulation at this point. Discussed importance of staying hydrated, and avoidance of caffeine/stimulants to avoid any worsening of his palpitations. F/up with his cardiologist in 5-7 days for ongoing evaluation and management of his recurrent palpitations. F/up with PCP for management of his scoliosis if it becomes an issue, although this seems to be a minor incidental finding. Potassium supplementation given here, no need for further repletion. I explained the diagnosis and have given explicit precautions to return to the ER including for any other new or worsening symptoms. The patient understands and accepts the medical plan as it's been dictated and I have answered their questions. Discharge instructions concerning home care and prescriptions have been given. The patient is STABLE and is discharged to home in good condition.   This patients CHA2DS2-VASc Score and unadjusted Ischemic Stroke Rate (% per year) is equal to 0.2 % stroke rate/year from a score of 0 Above score calculated as 1 point each if present [CHF, HTN, DM, Vascular=MI/PAD/Aortic Plaque, Age if 65-74, or Male] Above score calculated as 2 points each if present [Age > 75, or Stroke/TIA/TE]    Final Clinical Impressions(s) / ED Diagnoses   Final diagnoses:  Palpitations  Hypokalemia  Left chest pressure  SOB (shortness of breath)  PVC (premature ventricular contraction)  Scoliosis of thoracolumbar spine, unspecified scoliosis type    New Prescriptions New Prescriptions   No medications on file     Aaron Stager Camprubi-Soms, PA-C 05/09/16 0857    Layla Maw Ward, DO 05/09/16 2322

## 2016-05-17 NOTE — Progress Notes (Addendum)
Cardiology Office Note   Date:  05/18/2016   ID:  Aaron ChestnutJuwan Headrick, DOB 07/01/1996, MRN [id number]  PCP:  Rodrigo Ranrystal Dorsey, MD  Cardiologist:  Dr. Delton SeeNelson    Chief Complaint  Patient presents with  . Hospitalization Follow-up      History of Present Illness: Aaron Lutz is a 20 y.o. male who presents for post hospitalization for a fib with RVR HR 130-170 associated with chest pain.  He converted prior to arrival to ER.  Chest pain resolved after he converted to SR.  He was in a fib for about 1 hour.    Labs were normal except the K+ was 3.3.  Troponin I 0.0. Drug screen neg.    Last seen by Dr. Delton SeeNelson in 08/2014 for palpitations.  Echo then  With normal echo with normal LV function, mild TR.  48 hr. holter revealed SB at 48, sinus tach at 143, normal holter..   Today.he has had no further episodes.  No further chest pain.  No SOB.  I reviewed strips with Dr. Ladona Ridgelaylor of his A fib.      Past Medical History:  Diagnosis Date  . Genital herpes   . Nail biting   . Pollen allergies     No past surgical history on file.   Current Outpatient Prescriptions  Medication Sig Dispense Refill  . cetirizine (ZYRTEC) 10 MG tablet Take 1 tablet (10 mg total) by mouth daily as needed for allergies. (Patient not taking: Reported on 05/18/2016) 30 tablet 2  . fluticasone (FLONASE) 50 MCG/ACT nasal spray Place 2 sprays into both nostrils daily. (Patient not taking: Reported on 05/18/2016) 16 g 2  . PATADAY 0.2 % SOLN One drop to each eye daily. (Patient not taking: Reported on 05/18/2016) 2.5 mL 2   No current facility-administered medications for this visit.     Allergies:   Pollen extract    Social History:  The patient  reports that he has never smoked. He has never used smokeless tobacco. He reports that he does not drink alcohol or use drugs.   Family History:  The patient's family history includes Diabetes in his maternal grandmother; Hypertension in his maternal grandmother.    ROS:   General:no colds or fevers, some weight loss since 2016. Skin:no rashes or ulcers HEENT:no blurred vision, no congestion CV:see HPI PUL:see HPI GI:no diarrhea constipation or melena, no indigestion GU:no hematuria, no dysuria MS:no joint pain, no claudication Neuro:no syncope, no lightheadedness Endo:no diabetes, no thyroid disease  Wt Readings from Last 3 Encounters:  05/18/16 138 lb (62.6 kg)  10/08/15 140 lb 12.8 oz (63.9 kg) (26 %, Z= -0.63)*  01/31/15 142 lb (64.4 kg) (32 %, Z= -0.48)*   * Growth percentiles are based on CDC 2-20 Years data.     PHYSICAL EXAM: VS:  BP 132/72   Pulse 66   Ht 6' 0.5" (1.842 m)   Wt 138 lb (62.6 kg)   BMI 18.46 kg/m  , BMI Body mass index is 18.46 kg/m. General:Pleasant affect, NAD Skin:Warm and dry, brisk capillary refill HEENT:normocephalic, sclera clear, mucus membranes moist Neck:supple, no JVD, no bruits  Heart:S1S2 RRR without murmur, gallup, rub or click Lungs:clear without rales, rhonchi, or wheezes XLK:GMWNAbd:soft, non tender, + BS, do not palpate liver spleen or masses Ext:no lower ext edema, 2+ pedal pulses, 2+ radial pulses Neuro:alert and oriented X 3, MAE, follows commands, + facial symmetry    EKG:  EKG is ordered today. The ekg ordered today demonstrates SR  to    Recent Labs: 05/09/2016: BUN 7; Creatinine, Ser 0.93; Hemoglobin 15.5; Magnesium 2.1; Platelets 387; Potassium 3.3; Sodium 139; TSH 1.919    Lipid Panel No results found for: CHOL, TRIG, HDL, CHOLHDL, VLDL, LDLCALC, LDLDIRECT     Other studies Reviewed: Additional studies/ records that were reviewed today include: . ECHO: Study Conclusions  - Left ventricle: The cavity size was normal. Wall thickness was normal. Systolic function was vigorous. The estimated ejection fraction was in the range of 65% to 70%. Wall motion was normal; there were no regional wall motion abnormalities. Left ventricular diastolic function parameters were normal. - Left  atrium: The atrium was normal in size.  Impressions:  - Normal study.  ASSESSMENT AND PLAN:  1. PAF with RVR converted spontaneously -it had slowed when EMS ran the strip and then he converted. Reviewed with Dr. Ladona Ridgelaylor will add cardizem CD 120 mg and have pt follow up with Dr. Elberta Fortisamnitz for possible ablation (sript of A fib is in ER provider note)  CHA2DS2VASc score = 0  2. Chest pain due to rapid HR.  No further episodes.   3.  Hypokalemia will recheck BMP today.  Instructed him to eat bananas and oranges but if remains low will place on low dose K+   Current medicines are reviewed with the patient today.  The patient Has no concerns regarding medicines.  The following changes have been made:  See above Labs/ tests ordered today include:see above  Disposition:   FU:  see above  Signed, Nada BoozerLaura Skyah Hannon, NP  05/18/2016 10:53 AM    [facility] [address], [place], [place]  [address]/ [address] [place], [place] Phone: [phone]; Fax: [phone]  [phone]

## 2016-05-18 ENCOUNTER — Ambulatory Visit (INDEPENDENT_AMBULATORY_CARE_PROVIDER_SITE_OTHER): Payer: Medicaid Other | Admitting: Cardiology

## 2016-05-18 ENCOUNTER — Encounter: Payer: Self-pay | Admitting: Cardiology

## 2016-05-18 VITALS — BP 132/72 | HR 66 | Ht 72.5 in | Wt 138.0 lb

## 2016-05-18 DIAGNOSIS — R011 Cardiac murmur, unspecified: Secondary | ICD-10-CM | POA: Diagnosis not present

## 2016-05-18 DIAGNOSIS — E876 Hypokalemia: Secondary | ICD-10-CM | POA: Diagnosis not present

## 2016-05-18 DIAGNOSIS — I480 Paroxysmal atrial fibrillation: Secondary | ICD-10-CM

## 2016-05-18 LAB — BASIC METABOLIC PANEL
BUN: 11 mg/dL (ref 7–25)
CO2: 26 mmol/L (ref 20–31)
CREATININE: 0.85 mg/dL (ref 0.60–1.35)
Calcium: 9.6 mg/dL (ref 8.6–10.3)
Chloride: 105 mmol/L (ref 98–110)
GLUCOSE: 87 mg/dL (ref 65–99)
POTASSIUM: 4 mmol/L (ref 3.5–5.3)
Sodium: 140 mmol/L (ref 135–146)

## 2016-05-18 MED ORDER — DILTIAZEM HCL ER COATED BEADS 120 MG PO CP24: 120.0000 mg | ORAL_CAPSULE | Freq: Every day | ORAL | 6 refills | Status: AC

## 2016-05-18 NOTE — Addendum Note (Signed)
Addended by: Neoma LamingPUGH, Ayodele Hartsock J on: 05/18/2016 11:33 AM   Modules accepted: Orders

## 2016-05-18 NOTE — Patient Instructions (Signed)
Start Cardizem CD 120 mg daily   Schedule appointment with Dr.Camnitz per Dr.Taylor

## 2016-05-18 NOTE — Addendum Note (Signed)
Addended by: Tonita PhoenixBOWDEN, Tramayne Sebesta K on: 05/18/2016 11:31 AM   Modules accepted: Orders

## 2016-05-19 ENCOUNTER — Ambulatory Visit (INDEPENDENT_AMBULATORY_CARE_PROVIDER_SITE_OTHER): Payer: Medicaid Other | Admitting: Family Medicine

## 2016-05-19 ENCOUNTER — Encounter: Payer: Self-pay | Admitting: Family Medicine

## 2016-05-19 VITALS — BP 108/60 | Temp 98.3°F | Ht 73.0 in | Wt 140.2 lb

## 2016-05-19 DIAGNOSIS — Z23 Encounter for immunization: Secondary | ICD-10-CM | POA: Diagnosis not present

## 2016-05-19 DIAGNOSIS — I480 Paroxysmal atrial fibrillation: Secondary | ICD-10-CM | POA: Diagnosis not present

## 2016-05-19 DIAGNOSIS — R070 Pain in throat: Secondary | ICD-10-CM

## 2016-05-19 DIAGNOSIS — Z0000 Encounter for general adult medical examination without abnormal findings: Secondary | ICD-10-CM | POA: Diagnosis not present

## 2016-05-19 NOTE — Progress Notes (Signed)
20 y.o. year old male presents for annual preventative visit.   Acute Concerns: Patient notes an odd throat discomfort for the last 2 weeks. The patient notes that he has had issues since he went to the emergency room with a viral infection and noted to be in atrial fibrillation with RVR. He notes that initially the discomfort was on the left side, but has now migrated towards the right side. He denies any true pain. He states that it just feels like there is a bulge when he swallows. He denies any difficulty with swallowing liquids or solids. He denies any pain with swallowing. He denies any drooling, fevers, chills. He is never had this before. He feels that the discomfort is stable, but notes that it has migrated.    As far as his paroxysmal A. fib, he saw cardiology yesterday and never filled Cardizem. He states that he was warned about the side effects such as lower extremity swelling. He is scheduled to see Dr. Hassell Done with EP cardiology on November 15 to discuss possible ablation versus other alternatives.  PHQ-9 4, somewhat difficult   Diet: Varied  Exercise: Not regularly, but plays basketball with his friends.  Sexual/Birth History: Currently sexually active with his girlfriend who has accompanied him. Denies any concerns for STD. Approximately 2 years ago he was noted to have a penile lesion, but notes that this is completely resolved and does not wish to be examined.   Social:  Social History   Social History  . Marital status: Single    Spouse name: N/A  . Number of children: N/A  . Years of education: N/A   Social History Main Topics  . Smoking status: Never Smoker  . Smokeless tobacco: Never Used  . Alcohol use No  . Drug use: No  . Sexual activity: Yes   Other Topics Concern  . None   Social History Narrative  . None  He goes to Aud[employer], works at [employer] and works at [employer]. He denies any alcohol, drug, or tobacco  use.  Immunization: Immunization History  Administered Date(s) Administered  . DTP 02/29/1996, 05/16/1996, 09/05/1996, 08/30/1997, 03/11/2000  . H1N1 05/09/2008  . HPV Quadrivalent 01/08/2012, 03/23/2012, 09/15/2012  . Hepatitis A 04/13/2005, 03/02/2008  . Hepatitis B 02/29/1996, 09/05/1996  . HiB (PRP-OMP) 02/29/1996, 05/16/1996, 09/05/1996, 08/30/1997  . Influenza,inj,Quad PF,36+ Mos 10/08/2015, 05/19/2016  . MMR 08/30/1997, 03/11/2000  . Meningococcal Conjugate 01/08/2012  . Meningococcal Polysaccharide 03/02/2008  . OPV 02/29/1996, 05/16/1996, 08/30/1997, 03/11/2000  . PPD Test 11/27/2013  . Pneumococcal Conjugate-13 03/11/2000  . Td 04/14/2007  . Tdap 04/14/2007  . Varicella 08/30/1997, 03/02/2008     Physical Exam: Blood pressure 108/60, temperature 98.3 F (36.8 C), temperature source Oral, height 6' 1"  (1.854 m), weight 140 lb 3.2 oz (63.6 kg).  GEN: Pleasant male, NAD HEENT: Normocephalic, PERRL, EOMI, no scleral icterus, bilateral TM pearly grey, nasal septum midline, MMM, uvula midline Neck: Shotty anterior cervical lymphadenopathy, no posterior LAD, non-tender.  No thyromegaly CARDIAC: Irregular rate and rhythm. No m/r/g noted. No thrill noted.  RESP: CTAB, normal effort ABD: soft, no tenderness, normal bowel sounds GU/GYN:deferred per patient request.  EXT: No edema, 2+ radial and DP pulses SKIN: no rash  ASSESSMENT & PLAN: 20 y.o. male presents for annual preventative exam. Please see problem specific assessment and plan.   AF (paroxysmal atrial fibrillation) ([place]) Asymptomatic currently. CHADvasc 0.  - stressed the importance of starting cardizem - f/u with EP cardiology.  Health care maintenance Flu vaccine given today.  Tdap due 03/2017.  Discussed stress management (pt has 2 jobs and is in college), safe sex practices, and abstaining from drugs, alcohol, and tobacco. Also discussed regular aerobic exercise as tolerated.    Throat  discomfort Doesn't exactly sound as though this is globus pharyngeus. Possibly secondary to recent viral infection (still has some shotty LAD). It doesn't sound like sialadenitis as there is no pain.  - watchful waiting - if no improvement in the next 2-4 weeks as LAD improves, may consider referral to ENT for evaluation.

## 2016-05-19 NOTE — Patient Instructions (Addendum)
It was good to see you again.  If you note any rapid heart beat, please strongly consider starting the cardizem   If your throat discomfort does not improve, please call our office and I can refer you to ENT.  Things to do to Keep yourself Healthy  - Exercise at least 30-45 minutes a day,  3-4 days a week.  - Eat a low-fat diet with lots of fruits and vegetables, up to 7-9 servings per day. - Seatbelts can save your life. Wear them always. - Safe sex - if you may be exposed to STDs, use a condom. - Alcohol If you drink, do it moderately,less than 2 drinks per dayt. - Depression is common in our stressful world.If you're feeling down or losing interest in things you normally enjoy, please come in for a visit.

## 2016-05-20 DIAGNOSIS — I480 Paroxysmal atrial fibrillation: Secondary | ICD-10-CM | POA: Insufficient documentation

## 2016-05-20 DIAGNOSIS — R070 Pain in throat: Secondary | ICD-10-CM | POA: Insufficient documentation

## 2016-05-20 NOTE — Assessment & Plan Note (Signed)
Doesn't exactly sound as though this is globus pharyngeus. Possibly secondary to recent viral infection (still has some shotty LAD). It doesn't sound like sialadenitis as there is no pain.  - watchful waiting - if no improvement in the next 2-4 weeks as LAD improves, may consider referral to ENT for evaluation.

## 2016-05-20 NOTE — Assessment & Plan Note (Signed)
Flu vaccine given today.  Tdap due 03/2017.  Discussed stress management (pt has 2 jobs and is in college), safe sex practices, and abstaining from drugs, alcohol, and tobacco. Also discussed regular aerobic exercise as tolerated.

## 2016-05-20 NOTE — Assessment & Plan Note (Signed)
Asymptomatic currently. CHADvasc 0.  - stressed the importance of starting cardizem - f/u with EP cardiology.

## 2016-06-03 ENCOUNTER — Encounter: Payer: Self-pay | Admitting: Cardiology

## 2016-06-03 ENCOUNTER — Ambulatory Visit (INDEPENDENT_AMBULATORY_CARE_PROVIDER_SITE_OTHER): Payer: Medicaid Other | Admitting: Cardiology

## 2016-06-03 VITALS — BP 118/88 | HR 62 | Ht 72.0 in | Wt 141.0 lb

## 2016-06-03 DIAGNOSIS — I480 Paroxysmal atrial fibrillation: Secondary | ICD-10-CM

## 2016-06-03 NOTE — Progress Notes (Signed)
Electrophysiology Office Note   Date:  06/03/2016   ID:  Aaron Lutz, DOB 01/01/1996, MRN [id number]  PCP:  Rodrigo Ranrystal Dorsey, MD  Cardiologist:  Delton SeeNelson Primary Electrophysiologist:  Sunaina Ferrando Jorja LoaMartin Neta Upadhyay, MD    Chief Complaint  Patient presents with  . CONSULT    PAF     History of Present Illness: Aaron ChestnutJuwan Peppel is a 20 y.o. male who presents today for electrophysiology evaluation.   Was previously In the emergency room for AF with RVR with rates into the 130-170s. He said that he awoke at 5 in the morning and was in atrial fibrillation then. He drove to his girlfriend's house and prayed with her family. When EMS arrived he was in atrial fibrillation. Was in AF for 1 hour. Converted to sinus rhythm before arrival to the ER and chest pain resolved. Has had palpitations in the past with normal echo. Holter done 2016 showed no abnormalities. The week prior to the episode, he had some cold and flu symptoms, was taking Robitussin and NyQuil up until Wednesday night (3 days ago), but denies any other decongestant use. Denies any energy drinks or caffeine intake recently. Denies any illicit drug use. He is a nonsmoker with no family history of cardiac disease. Since his visit to the emergency room, he is felt well without any major complaints. He says that he does not drink alcohol.   Today, he denies symptoms of palpitations, chest pain, shortness of breath, orthopnea, PND, lower extremity edema, claudication, dizziness, presyncope, syncope, bleeding, or neurologic sequela. The patient is tolerating medications without difficulties and is otherwise without complaint today.    Past Medical History:  Diagnosis Date  . Genital herpes   . Nail biting   . PAF (paroxysmal atrial fibrillation) (HCC)   . Pollen allergies    No past surgical history on file.   Current Outpatient Prescriptions  Medication Sig Dispense Refill  . diltiazem (CARDIZEM CD) 120 MG 24 hr capsule Take 1 capsule (120 mg total)  by mouth daily. (Patient not taking: Reported on 06/03/2016) 30 capsule 6   No current facility-administered medications for this visit.     Allergies:   Nickel and Pollen extract   Social History:  The patient  reports that he has never smoked. He has never used smokeless tobacco. He reports that he does not drink alcohol or use drugs.   Family History:  The patient's family history includes Diabetes in his maternal grandmother; Hypertension in his maternal grandmother.    ROS:  Please see the history of present illness.   Otherwise, review of systems is positive for palpitations, chest pain.   All other systems are reviewed and negative.    PHYSICAL EXAM: VS:  BP 118/88   Pulse 62   Ht 6' (1.829 m)   Wt 141 lb (64 kg)   BMI 19.12 kg/m  , BMI Body mass index is 19.12 kg/m. GEN: Well nourished, well developed, in no acute distress  HEENT: normal  Neck: no JVD, carotid bruits, or masses Cardiac: RRR; no murmurs, rubs, or gallops,no edema  Respiratory:  clear to auscultation bilaterally, normal work of breathing GI: soft, nontender, nondistended, + BS MS: no deformity or atrophy  Skin: warm and dry Neuro:  Strength and sensation are intact Psych: euthymic mood, full affect  EKG:  EKG is ordered today. Personal review of the ekg ordered shows sinus rhythm, rate 62  Recent Labs: 05/09/2016: Hemoglobin 15.5; Magnesium 2.1; Platelets 387; TSH 1.919 05/18/2016: BUN 11; Creat  0.85; Potassium 4.0; Sodium 140    Lipid Panel  No results found for: CHOL, TRIG, HDL, CHOLHDL, VLDL, LDLCALC, LDLDIRECT   Wt Readings from Last 3 Encounters:  06/03/16 141 lb (64 kg)  05/19/16 140 lb 3.2 oz (63.6 kg)  05/18/16 138 lb (62.6 kg)      Other studies Reviewed: Additional studies/ records that were reviewed today include: 08/2014 TTE  Review of the above records today demonstrates:  - Left ventricle: The cavity size was normal. Wall thickness was normal. Systolic function was  vigorous. The estimated ejection fraction was in the range of 65% to 70%. Wall motion was normal; there were no regional wall motion abnormalities. Left ventricular diastolic function parameters were normal. - Left atrium: The atrium was normal in size.   ASSESSMENT AND PLAN:  1.  Paroxysmal atrial fibrillation: was started on diltiazem 120 mg. AF documented on rhythm strip in ER provider note from 05/09/16. He has not taken the diltiazem as he noted that there were side effects such as constipation and lower extremity swelling. I have encouraged him to fill his prescription and to take the medications if he needs them for episodes of palpitations. We Kito Cuffe continue to monitor him for his atrial fibrillation. He is young to have this, but does not have any family history, history of illicit drug use, alcohol or caffeine use. His EKG is normal without evidence of an accessory pathway. Should he have further episodes, he may benefit from an EP study to see if he has an accessory pathway that may explain his episodes of atrial fibrillation.  This patients CHA2DS2-VASc Score and unadjusted Ischemic Stroke Rate (% per year) is equal to 0.2 % stroke rate/year from a score of 0  Above score calculated as 1 point each if present [CHF, HTN, DM, Vascular=MI/PAD/Aortic Plaque, Age if 65-74, or Male] Above score calculated as 2 points each if present [Age > 75, or Stroke/TIA/TE]       Current medicines are reviewed at length with the patient today.   The patient does not have concerns regarding his medicines.  The following changes were made today:  none  Labs/ tests ordered today include:  No orders of the defined types were placed in this encounter.    Disposition:   FU with Maalle Starrett 6 months  Signed, Truda Staub Jorja LoaMartin Tavita Eastham, MD  06/03/2016 10:37 AM     [facility] [address] [place] [address] [phone] (office) [phone] (fax)

## 2016-06-03 NOTE — Patient Instructions (Signed)

## 2016-10-30 ENCOUNTER — Encounter: Payer: Self-pay | Admitting: Internal Medicine

## 2016-10-30 ENCOUNTER — Ambulatory Visit (INDEPENDENT_AMBULATORY_CARE_PROVIDER_SITE_OTHER): Payer: Medicaid Other | Admitting: Internal Medicine

## 2016-10-30 DIAGNOSIS — J069 Acute upper respiratory infection, unspecified: Secondary | ICD-10-CM

## 2016-10-30 MED ORDER — BENZONATATE 200 MG PO CAPS
200.0000 mg | ORAL_CAPSULE | Freq: Three times a day (TID) | ORAL | 0 refills | Status: AC | PRN
Start: 2016-10-30 — End: ?

## 2016-10-30 MED ORDER — LORATADINE 10 MG PO TABS: 10.0000 mg | ORAL_TABLET | Freq: Every day | ORAL | 11 refills | Status: AC

## 2016-10-30 NOTE — Assessment & Plan Note (Signed)
Symptoms most consistent with viral URI with possible allergic component, as patient previously diagnosed with seasonal allergies but not currently taking prescribed medication. Well-appearing and well-hydrated on exam with lungs CTAB and no respiratory distress. Will treat supportively at this time.  - Tessalon perles PRN - Patient wishing to switch from Zyrtec to another allergy med. Prescribed Claritin qd.

## 2016-10-30 NOTE — Progress Notes (Signed)
   Subjective:   Patient: Aaron Lutz       Birthdate: 10-04-1995       MRN: [id number]      HPI  Aaron Lutz is a 21 y.o. male presenting for same day appointment for cough.   Symptoms began two weeks ago. Reports cough, minimal sore throat, and runny nose. Has taken Robitussin which helped some. Says that now he is coughing more frequently. Is able to sleep at night despite cough. Has normal appetite. No fevers or chills. No difficulty breathing or wheezing. No sick contacts. Has history of seasonal allergies for which he has been prescribed Zyrtec, though has not been taking this. Reports cough productive of yellow phlegm.  Smoking status reviewed. Patient is never smoker.   Review of Systems See HPI.     Objective:  Physical Exam  Constitutional: He is oriented to person, place, and time and well-developed, well-nourished, and in no distress.  HENT:  Head: Normocephalic and atraumatic.  Nose: Nose normal.  Mouth/Throat: Oropharynx is clear and moist. No oropharyngeal exudate.  Eyes: Conjunctivae and EOM are normal. Pupils are equal, round, and reactive to light. Right eye exhibits no discharge. Left eye exhibits no discharge.  Cardiovascular: Normal rate, regular rhythm and normal heart sounds.   No murmur heard. Pulmonary/Chest: Effort normal and breath sounds normal. No respiratory distress. He has no wheezes.  Neurological: He is alert and oriented to person, place, and time.  Skin: Skin is warm and dry.  Psychiatric: Affect and judgment normal.      Assessment & Plan:  URI (upper respiratory infection) Symptoms most consistent with viral URI with possible allergic component, as patient previously diagnosed with seasonal allergies but not currently taking prescribed medication. Well-appearing and well-hydrated on exam with lungs CTAB and no respiratory distress. Will treat supportively at this time.  - Tessalon perles PRN - Patient wishing to switch from Zyrtec to another  allergy med. Prescribed Claritin qd.    Tarri Abernethy, MD, MPH PGY-2 [facility] Pager [phone]

## 2016-10-30 NOTE — Patient Instructions (Addendum)
It was nice meeting you today Aaron Lutz!  For your cough, you can begin taking one Tessalon perle up to every 8 hours as needed for cough. Also please begin taking Claritin daily. You can continue to take this for as long as you need.   For your cough, you can also try eating a spoonful of honey, either alone or mixed into a warm beverage, three or four times a day, especially before bed. Sleeping propped up on a few pillows can also help if your symptoms are worse at night.   You can take ibuprofen or Tylenol if your throat is hurting.   If you have any questions or concerns, please feel free to call the clinic.   Be well,  Dr. Natale Milch

## 2017-03-08 IMAGING — DX DG CHEST 2V
2 series · 2 of 2 positions shown · non-contrast
Comparison: No comparison chest x-ray. Comparison the AP thoracic
plain film exam 03/02/2008.

CLINICAL DATA: 20-year-old male with left upper chest tightness
since early this morning. Heart racing. Shaking. Initial encounter.

EXAM:
CHEST  2 VIEW

[chest pa]
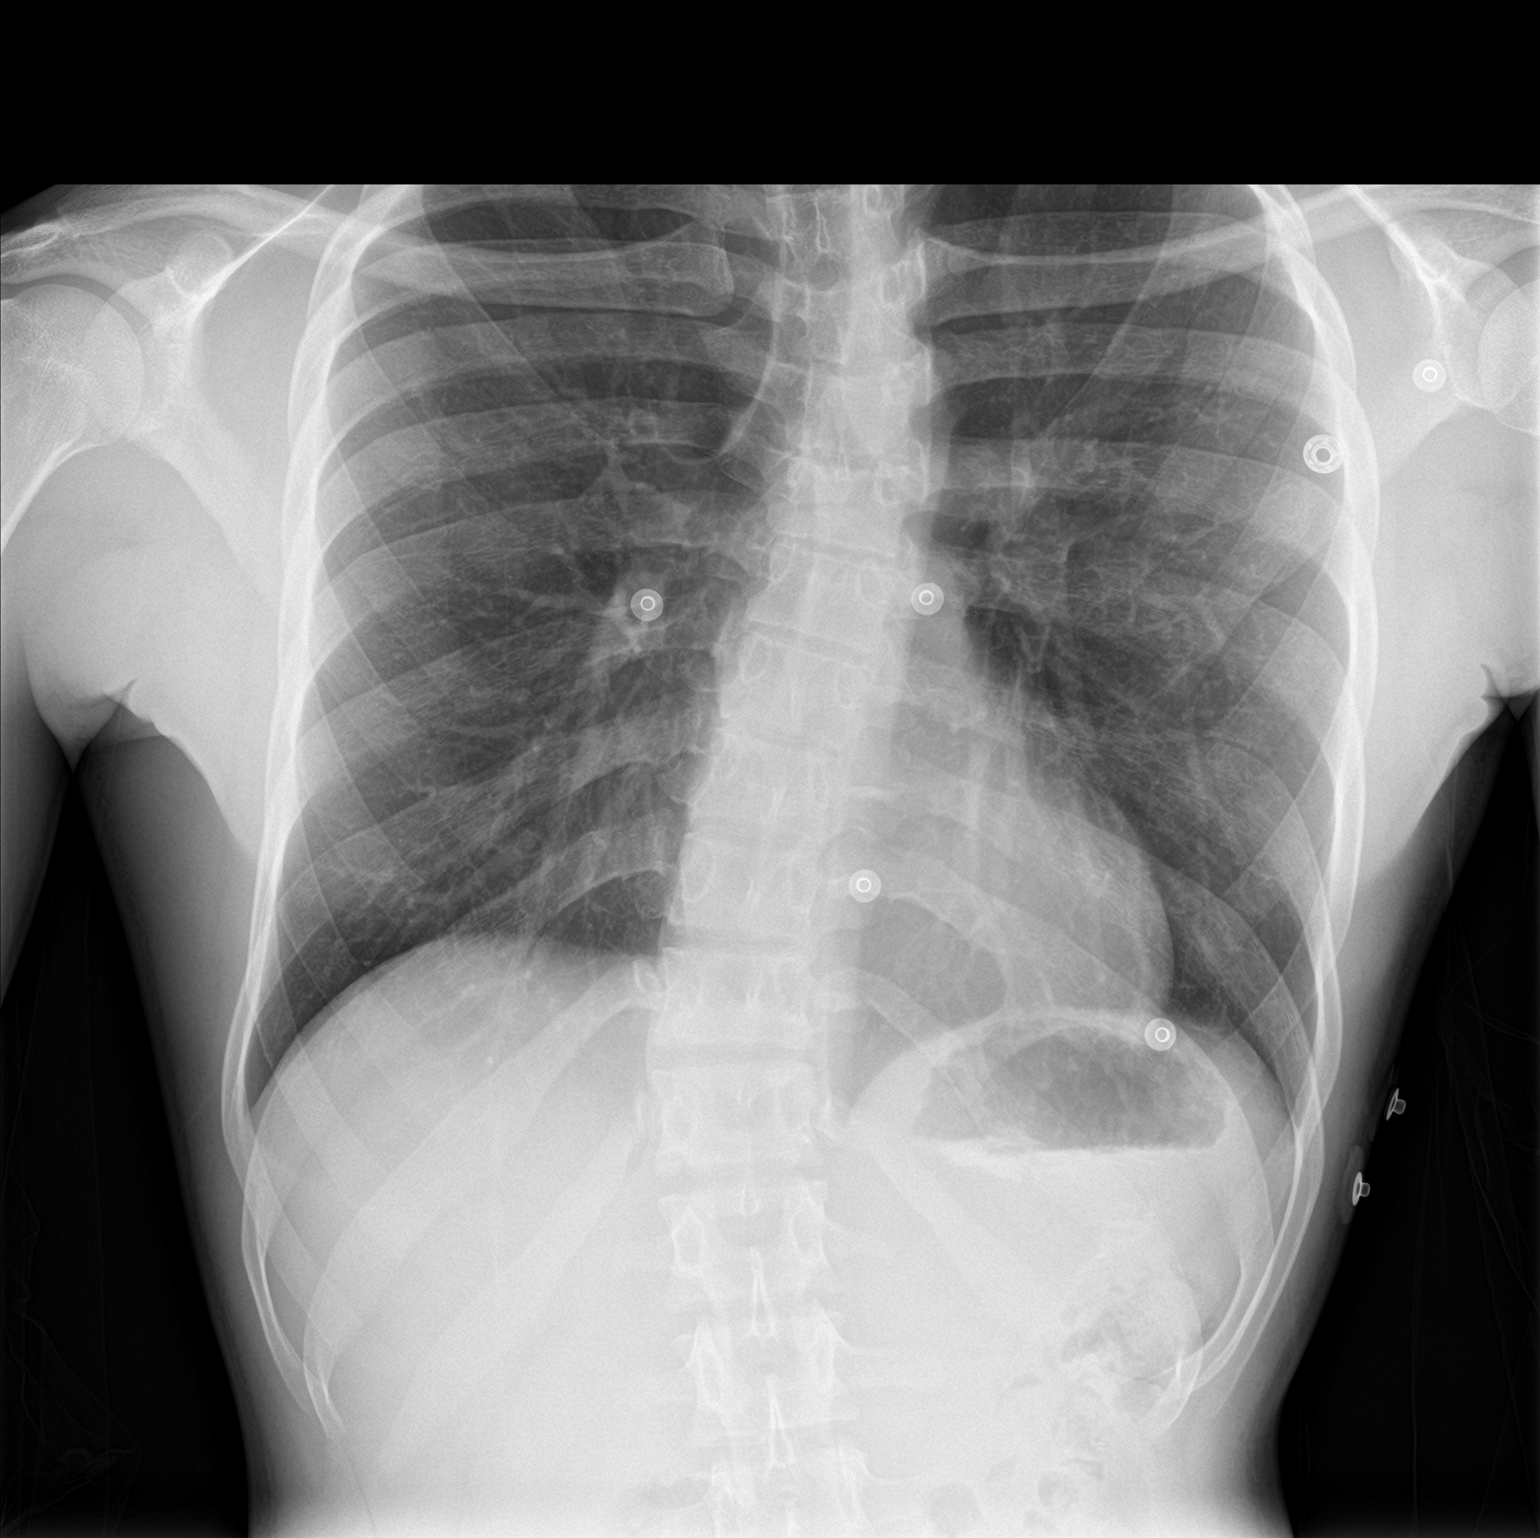

[chest lat]
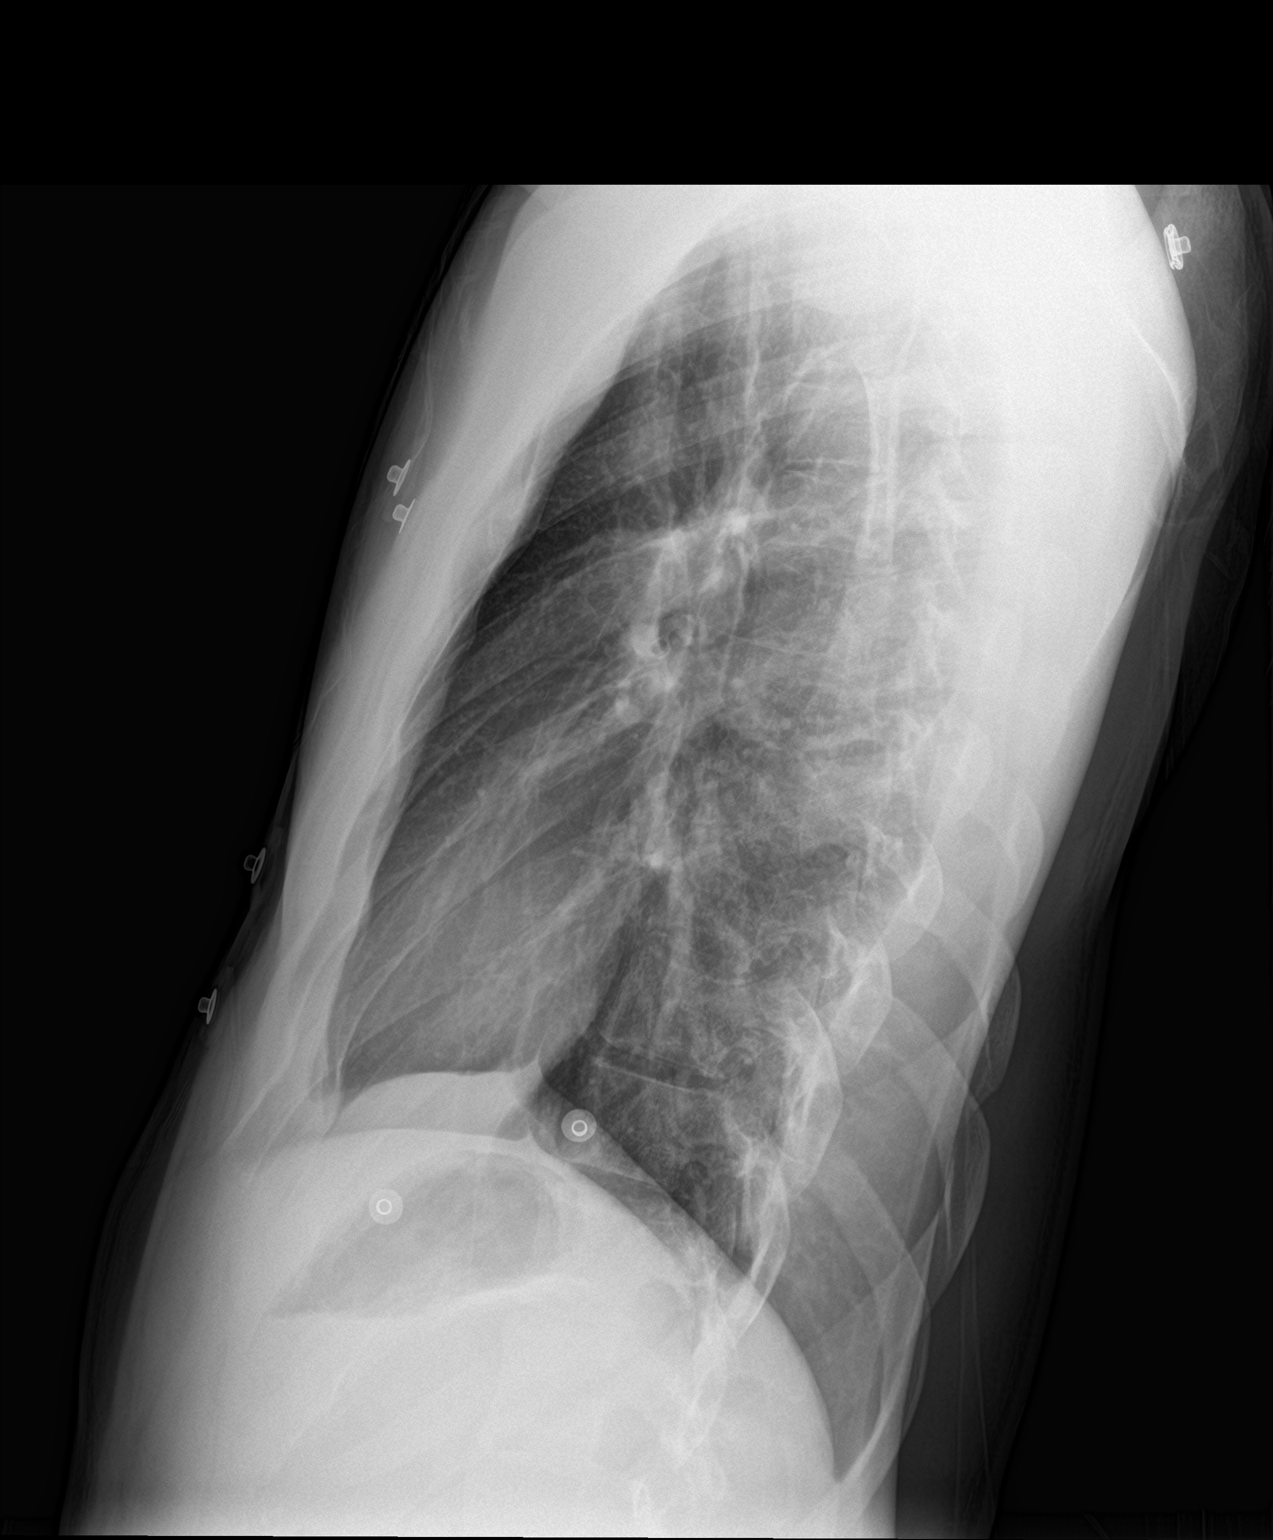

[2 of 2 positions shown; findings below may reference images not displayed]

FINDINGS: No infiltrate, congestive heart failure or pneumothorax.

S shaped scoliosis thoracic and upper lumbar spine. Slight
distortion of the mediastinal and cardiac silhouette which are
within normal limits.
IMPRESSION: Lungs are clear.

S shaped scoliosis thoracic and upper lumbar spine. Slight
distortion of the mediastinal and cardiac silhouette which are
within normal limits.

## 2017-08-28 ENCOUNTER — Encounter (HOSPITAL_COMMUNITY): Payer: Self-pay | Admitting: Emergency Medicine

## 2017-08-28 ENCOUNTER — Emergency Department (HOSPITAL_COMMUNITY)
Admission: EM | Admit: 2017-08-28 | Discharge: 2017-08-28 | Disposition: A | Discharge status: Home / Self Care | Payer: Self-pay | Attending: Emergency Medicine | Admitting: Emergency Medicine

## 2017-08-28 ENCOUNTER — Other Ambulatory Visit: Payer: Self-pay

## 2017-08-28 DIAGNOSIS — R369 Urethral discharge, unspecified: Secondary | ICD-10-CM

## 2017-08-28 DIAGNOSIS — R300 Dysuria: Secondary | ICD-10-CM | POA: Insufficient documentation

## 2017-08-28 DIAGNOSIS — Z202 Contact with and (suspected) exposure to infections with a predominantly sexual mode of transmission: Secondary | ICD-10-CM | POA: Insufficient documentation

## 2017-08-28 DIAGNOSIS — R360 Urethral discharge without blood: Secondary | ICD-10-CM | POA: Insufficient documentation

## 2017-08-28 DIAGNOSIS — Z711 Person with feared health complaint in whom no diagnosis is made: Secondary | ICD-10-CM

## 2017-08-28 MED ORDER — AZITHROMYCIN 250 MG PO TABS
1000.0000 mg | ORAL_TABLET | Freq: Once | ORAL | Status: AC
  Administered 2017-08-28: 1000 mg via ORAL
  Filled 2017-08-28: qty 4

## 2017-08-28 MED ORDER — LIDOCAINE HCL (PF) 1 % IJ SOLN
INTRAMUSCULAR | Status: AC
  Administered 2017-08-28: 5 mL
  Filled 2017-08-28: qty 5

## 2017-08-28 MED ORDER — CEFTRIAXONE SODIUM 250 MG IJ SOLR
250.0000 mg | Freq: Once | INTRAMUSCULAR | Status: AC
  Administered 2017-08-28: 250 mg via INTRAMUSCULAR
  Filled 2017-08-28: qty 250

## 2017-08-28 NOTE — ED Provider Notes (Signed)
[facility]   CSN: [id number] Arrival date & time: 08/28/17  0747     History   Chief Complaint Chief Complaint  Patient presents with  . Penile Discharge    HPI Aaron Lutz is a 22 y.o. male who presents to the ED with penile discharge. Patient reports having unprotected sex this past week and symptoms started 2 days later. Patient states that the symptoms started with occasional dysuria followed by the discharge.   The history is provided by the patient. No language interpreter was used.  Penile Discharge  This is a new problem. The current episode started more than 2 days ago. He has tried nothing for the symptoms.    Past Medical History:  Diagnosis Date  . Genital herpes   . Nail biting   . PAF (paroxysmal atrial fibrillation) (HCC)   . Pollen allergies     Patient Active Problem List   Diagnosis Date Noted  . URI (upper respiratory infection) 10/30/2016  . AF (paroxysmal atrial fibrillation) (HCC) 05/20/2016  . Throat discomfort 05/20/2016  . Tonsillolith 10/08/2015  . Health care maintenance 01/31/2015  . Systolic murmur 08/27/2014  . Palpitations 07/27/2014  . Lesion of penis 07/27/2014  . Risky sexual behavior 06/09/2013  . Allergic rhinitis 05/17/2013  . Skin irritation 01/08/2012    History reviewed. No pertinent surgical history.     Home Medications    Prior to Admission medications   Medication Sig Start Date End Date Taking? Authorizing Provider  benzonatate (TESSALON) 200 MG capsule Take 1 capsule (200 mg total) by mouth 3 (three) times daily as needed for cough. 10/30/16   Marquette SaaLancaster, Abigail Joseph, MD  diltiazem (CARDIZEM CD) 120 MG 24 hr capsule Take 1 capsule (120 mg total) by mouth daily. Patient not taking: Reported on 06/03/2016 05/18/16   Leone BrandIngold, Laura R, NP  loratadine (CLARITIN) 10 MG tablet Take 1 tablet (10 mg total) by mouth daily. 10/30/16   Marquette SaaLancaster, Abigail Joseph, MD     Family History Family History  Problem Relation Age of Onset  . Hypertension Maternal Grandmother   . Diabetes Maternal Grandmother     Social History Social History   Tobacco Use  . Smoking status: Never Smoker  . Smokeless tobacco: Never Used  Substance Use Topics  . Alcohol use: No  . Drug use: No     Allergies   Nickel and Pollen extract   Review of Systems Review of Systems  Genitourinary: Positive for discharge and dysuria. Negative for difficulty urinating, frequency, scrotal swelling and testicular pain.  All other systems reviewed and are negative.    Physical Exam Updated Vital Signs BP 119/68 (BP Location: Right Arm)   Pulse 65   Temp 99 F (37.2 C) (Oral)   Resp 17   Ht 6\' 1"  (1.854 m)   Wt 68 kg (150 lb)   SpO2 99%   BMI 19.79 kg/m   Physical Exam  Constitutional: He appears well-developed and well-nourished. No distress.  HENT:  Head: Normocephalic.  Eyes: EOM are normal.  Neck: Neck supple.  Cardiovascular: Normal rate.  Pulmonary/Chest: Effort normal.  Abdominal: Soft. There is no tenderness.  Genitourinary: Testes normal. Circumcised. No penile tenderness. Discharge found.  Genitourinary Comments: Yellow ureteral discharge  Musculoskeletal: Normal range of motion.  Lymphadenopathy: Inguinal adenopathy noted on the right side.  Neurological: He is alert.  Skin: Skin is warm and dry.  Psychiatric: He has a normal mood and affect.  Nursing note  and vitals reviewed.    ED Treatments / Results  Labs (all labs ordered are listed, but only abnormal results are displayed) Labs Reviewed  RPR  HIV ANTIBODY (ROUTINE TESTING)  GC/CHLAMYDIA PROBE AMP ([place]) NOT AT [facility]     Radiology No results found.  Procedures Procedures (including critical care time)  Medications Ordered in ED Medications  cefTRIAXone (ROCEPHIN) injection 250 mg (250 mg Intramuscular Given 08/28/17 0944)  azithromycin (ZITHROMAX) tablet 1,000 mg  (1,000 mg Oral Given 08/28/17 0943)  lidocaine (PF) (XYLOCAINE) 1 % injection (5 mLs  Given 08/28/17 0944)     Initial Impression / Assessment and Plan / ED Course  I have reviewed the triage vital signs and the nursing notes. Pt presents with concerns for possible STD.  Pt understands that they have GC/Chlamydia cultures pending and that they will need to inform all sexual partners if results return positive. Pt has been treated prophylactically with azithromycin and Rocephin due to pts history and physical exam.  Patient d/c with instructions to follow up with [facility]. Discussed importance of using protection when sexually active.   Final Clinical Impressions(s) / ED Diagnoses   Final diagnoses:  Penile discharge  Concern about STD in male without diagnosis    ED Discharge Orders    None       Kerrie Buffalo Jenkins, [place] 08/28/17 1809    Terrilee Files, MD 08/29/17 [phone]

## 2017-08-28 NOTE — ED Triage Notes (Signed)
Pt. Stated, I woke up and had some yellow discharge from my penis. Pt has had unprotected sex.

## 2017-08-29 LAB — RPR: RPR Ser Ql: NONREACTIVE

## 2017-08-29 LAB — HIV ANTIBODY (ROUTINE TESTING W REFLEX): HIV SCREEN 4TH GENERATION: NONREACTIVE

## 2017-08-31 LAB — GC/CHLAMYDIA PROBE AMP (~~LOC~~) NOT AT ARMC
CHLAMYDIA, DNA PROBE: POSITIVE — AB
NEISSERIA GONORRHEA: POSITIVE — AB

## 2017-10-17 ENCOUNTER — Emergency Department (HOSPITAL_COMMUNITY)
Admission: EM | Admit: 2017-10-17 | Discharge: 2017-10-17 | Disposition: A | Discharge status: Home / Self Care | Payer: Self-pay | Attending: Emergency Medicine | Admitting: Emergency Medicine

## 2017-10-17 ENCOUNTER — Encounter (HOSPITAL_COMMUNITY): Payer: Self-pay | Admitting: Emergency Medicine

## 2017-10-17 DIAGNOSIS — R369 Urethral discharge, unspecified: Secondary | ICD-10-CM

## 2017-10-17 DIAGNOSIS — R300 Dysuria: Secondary | ICD-10-CM | POA: Insufficient documentation

## 2017-10-17 DIAGNOSIS — Z79899 Other long term (current) drug therapy: Secondary | ICD-10-CM | POA: Insufficient documentation

## 2017-10-17 LAB — URINALYSIS, ROUTINE W REFLEX MICROSCOPIC
BILIRUBIN URINE: NEGATIVE
Glucose, UA: NEGATIVE mg/dL
HGB URINE DIPSTICK: NEGATIVE
KETONES UR: 5 mg/dL — AB
Nitrite: NEGATIVE
PH: 6 (ref 5.0–8.0)
Protein, ur: 30 mg/dL — AB
Specific Gravity, Urine: 1.019 (ref 1.005–1.030)

## 2017-10-17 MED ORDER — LIDOCAINE HCL (PF) 1 % IJ SOLN
INTRAMUSCULAR | Status: AC
  Administered 2017-10-17: 5 mL
  Filled 2017-10-17: qty 5

## 2017-10-17 MED ORDER — AZITHROMYCIN 250 MG PO TABS
1000.0000 mg | ORAL_TABLET | Freq: Once | ORAL | Status: AC
Start: 2017-10-17 — End: 2017-10-17
  Administered 2017-10-17: 1000 mg via ORAL
  Filled 2017-10-17: qty 4

## 2017-10-17 MED ORDER — CEFTRIAXONE SODIUM 250 MG IJ SOLR
250.0000 mg | Freq: Once | INTRAMUSCULAR | Status: AC
  Administered 2017-10-17: 250 mg via INTRAMUSCULAR
  Filled 2017-10-17: qty 250

## 2017-10-17 NOTE — ED Notes (Signed)
Declined W/C at D/C and was escorted to lobby by RN. 

## 2017-10-17 NOTE — Discharge Instructions (Addendum)
You have been tested for STDs. Some of these results are still pending. Any abnormalities will be called to you. You have been empirically treated for gonorrhea and chlamydia. This does not mean you necessarily have these diseases, treatment is precautionary. Be sure to follow safe sex practices, including monogamy and/or condom use. All sexual partners must also be notified and treated.  Any future STD testing or treatment should be performed at [facility] or primary care office. For the treatment to be fully effective, avoid all sexual contact for at least 2 weeks after medication administration.

## 2017-10-17 NOTE — ED Provider Notes (Signed)
[facility] Provider Note   CSN: [id number] Arrival date & time: 10/17/17  1505     History   Chief Complaint Chief Complaint  Patient presents with  . Dysuria  . Penile Discharge    HPI Aaron Lutz is a 22 y.o. male.  HPI   Aaron Lutz is a 22 y.o. male, with a history of GC, chlamydia, presenting to the ED with yellow penile discharge beginning 3/28.  Accompanied by dysuria. States he was just treated for gonorrhea in February 2019. Patient is sexually active with one male partner, but states this partner is new since he was last diagnosed with gonorrhea. Denies fever/chills, abdominal pain, nausea/vomiting, pain with bowel movements, testicular pain or swelling, or any other complaints.   Past Medical History:  Diagnosis Date  . Genital herpes   . Nail biting   . PAF (paroxysmal atrial fibrillation) (HCC)   . Pollen allergies     Patient Active Problem List   Diagnosis Date Noted  . URI (upper respiratory infection) 10/30/2016  . AF (paroxysmal atrial fibrillation) (HCC) 05/20/2016  . Throat discomfort 05/20/2016  . Tonsillolith 10/08/2015  . Health care maintenance 01/31/2015  . Systolic murmur 08/27/2014  . Palpitations 07/27/2014  . Lesion of penis 07/27/2014  . Risky sexual behavior 06/09/2013  . Allergic rhinitis 05/17/2013  . Skin irritation 01/08/2012    History reviewed. No pertinent surgical history.      Home Medications    Prior to Admission medications   Medication Sig Start Date End Date Taking? Authorizing Provider  benzonatate (TESSALON) 200 MG capsule Take 1 capsule (200 mg total) by mouth 3 (three) times daily as needed for cough. 10/30/16   Marquette SaaLancaster, Abigail Joseph, MD  diltiazem (CARDIZEM CD) 120 MG 24 hr capsule Take 1 capsule (120 mg total) by mouth daily. Patient not taking: Reported on 06/03/2016 05/18/16   Leone BrandIngold, Laura R, NP  loratadine (CLARITIN) 10 MG tablet Take 1 tablet (10 mg total)  by mouth daily. 10/30/16   Marquette SaaLancaster, Abigail Joseph, MD    Family History Family History  Problem Relation Age of Onset  . Hypertension Maternal Grandmother   . Diabetes Maternal Grandmother     Social History Social History   Tobacco Use  . Smoking status: Never Smoker  . Smokeless tobacco: Never Used  Substance Use Topics  . Alcohol use: No  . Drug use: No     Allergies   Nickel and Pollen extract   Review of Systems Review of Systems  Constitutional: Negative for chills and fever.  Gastrointestinal: Negative for abdominal pain, diarrhea, nausea and vomiting.  Genitourinary: Positive for discharge and dysuria. Negative for difficulty urinating, penile swelling, scrotal swelling and testicular pain.  All other systems reviewed and are negative.    Physical Exam Updated Vital Signs BP 123/81 (BP Location: Right Arm)   Pulse 86   Temp 98.2 F (36.8 C) (Oral)   Resp 20   SpO2 100%   Physical Exam  Constitutional: He appears well-developed and well-nourished. No distress.  HENT:  Head: Normocephalic and atraumatic.  Eyes: Conjunctivae are normal.  Neck: Neck supple.  Cardiovascular: Normal rate, regular rhythm and intact distal pulses.  Pulmonary/Chest: Effort normal. No respiratory distress.  Abdominal: Soft. There is no tenderness. There is no guarding.  Genitourinary:  Genitourinary Comments: Yellow colored penile discharge dripping form penis.  Penis, scrotum, and testicles without swelling, lesions, or tenderness. No inguinal hernia noted. Cremasteric reflex intact. No inguinal lymphadenopathy. Otherwise  normal male genitalia. Med Tech, Daphine Deutscher, served as chaperone during the exam.  Musculoskeletal: He exhibits no edema.  Lymphadenopathy:    He has no cervical adenopathy.  Neurological: He is alert.  Skin: Skin is warm and dry. He is not diaphoretic.  Psychiatric: He has a normal mood and affect. His behavior is normal.  Nursing note and vitals  reviewed.    ED Treatments / Results  Labs (all labs ordered are listed, but only abnormal results are displayed) Labs Reviewed  URINALYSIS, ROUTINE W REFLEX MICROSCOPIC - Abnormal; Notable for the following components:      Result Value   APPearance HAZY (*)    Ketones, ur 5 (*)    Protein, ur 30 (*)    Leukocytes, UA LARGE (*)    Bacteria, UA RARE (*)    Squamous Epithelial / LPF 0-5 (*)    All other components within normal limits  URINE CULTURE  RPR  HIV ANTIBODY (ROUTINE TESTING)  GC/CHLAMYDIA PROBE AMP ([place]) NOT AT [facility]    EKG None  Radiology No results found.  Procedures Procedures (including critical care time)  Medications Ordered in ED Medications  cefTRIAXone (ROCEPHIN) injection 250 mg (has no administration in time range)  azithromycin (ZITHROMAX) tablet 1,000 mg (has no administration in time range)     Initial Impression / Assessment and Plan / ED Course  I have reviewed the triage vital signs and the nursing notes.  Pertinent labs & imaging results that were available during my care of the patient were reviewed by me and considered in my medical decision making (see chart for details).     Patient presents with penile discharge.  Symptoms suspicious for STD.  Most likely cause would be reinfection.  Discussed this with the patient.   Final Clinical Impressions(s) / ED Diagnoses   Final diagnoses:  Dysuria  Penile discharge    ED Discharge Orders    None       Little, [place] 10/17/17 1826    Rolland Porter, MD 10/19/17 0002

## 2017-10-17 NOTE — ED Triage Notes (Addendum)
Pt to ED c/o burning sensation with urination and yellow penile discharge x 2 days, reports unprotected sex but 1 partner. Denies fevers.

## 2017-10-18 LAB — RPR: RPR: NONREACTIVE

## 2017-10-18 LAB — GC/CHLAMYDIA PROBE AMP (~~LOC~~) NOT AT ARMC
CHLAMYDIA, DNA PROBE: NEGATIVE
Neisseria Gonorrhea: POSITIVE — AB

## 2017-10-18 LAB — HIV ANTIBODY (ROUTINE TESTING W REFLEX): HIV SCREEN 4TH GENERATION: NONREACTIVE

## 2017-11-16 ENCOUNTER — Other Ambulatory Visit: Payer: Self-pay

## 2017-11-16 ENCOUNTER — Encounter (HOSPITAL_COMMUNITY): Payer: Self-pay | Admitting: Emergency Medicine

## 2017-11-16 ENCOUNTER — Emergency Department (HOSPITAL_COMMUNITY)
Admission: EM | Admit: 2017-11-16 | Discharge: 2017-11-16 | Disposition: A | Discharge status: Home / Self Care | Payer: Self-pay | Attending: Emergency Medicine | Admitting: Emergency Medicine

## 2017-11-16 DIAGNOSIS — J111 Influenza due to unidentified influenza virus with other respiratory manifestations: Secondary | ICD-10-CM | POA: Insufficient documentation

## 2017-11-16 DIAGNOSIS — R69 Illness, unspecified: Secondary | ICD-10-CM

## 2017-11-16 LAB — CBC
HCT: 42.3 % (ref 39.0–52.0)
HEMOGLOBIN: 14.4 g/dL (ref 13.0–17.0)
MCH: 29.8 pg (ref 26.0–34.0)
MCHC: 34 g/dL (ref 30.0–36.0)
MCV: 87.6 fL (ref 78.0–100.0)
PLATELETS: 253 10*3/uL (ref 150–400)
RBC: 4.83 MIL/uL (ref 4.22–5.81)
RDW: 13.5 % (ref 11.5–15.5)
WBC: 10.7 10*3/uL — AB (ref 4.0–10.5)

## 2017-11-16 LAB — BASIC METABOLIC PANEL
ANION GAP: 10 (ref 5–15)
BUN: 7 mg/dL (ref 6–20)
CALCIUM: 9.2 mg/dL (ref 8.9–10.3)
CHLORIDE: 99 mmol/L — AB (ref 101–111)
CO2: 24 mmol/L (ref 22–32)
CREATININE: 0.96 mg/dL (ref 0.61–1.24)
GFR calc non Af Amer: >60 mL/min (ref >60)
Glucose, Bld: 117 mg/dL — ABNORMAL HIGH (ref 65–99)
Potassium: 3.6 mmol/L (ref 3.5–5.1)
SODIUM: 133 mmol/L — AB (ref 135–145)

## 2017-11-16 LAB — URINALYSIS, ROUTINE W REFLEX MICROSCOPIC
BILIRUBIN URINE: NEGATIVE
GLUCOSE, UA: NEGATIVE mg/dL
HGB URINE DIPSTICK: NEGATIVE
KETONES UR: 20 mg/dL — AB
LEUKOCYTES UA: NEGATIVE
Nitrite: NEGATIVE
PH: 5 (ref 5.0–8.0)
Protein, ur: 30 mg/dL — AB
Specific Gravity, Urine: 1.023 (ref 1.005–1.030)

## 2017-11-16 MED ORDER — KETOROLAC TROMETHAMINE 60 MG/2ML IM SOLN
15.0000 mg | Freq: Once | INTRAMUSCULAR | Status: AC
  Administered 2017-11-16: 15 mg via INTRAMUSCULAR
  Filled 2017-11-16: qty 2

## 2017-11-16 MED ORDER — ACETAMINOPHEN 325 MG PO TABS
650.0000 mg | ORAL_TABLET | Freq: Once | ORAL | Status: AC | PRN
  Administered 2017-11-16: 650 mg via ORAL
  Filled 2017-11-16: qty 2

## 2017-11-16 NOTE — ED Notes (Signed)
Pt informed we need to collect urine specimen; Pt verbalized understanding.

## 2017-11-16 NOTE — ED Triage Notes (Signed)
Pt states Sunday he started feeling dizzy, generalized body aches, light sensitivity, HA. Pt also states he is having trouble walking because he feels like he will pass out. Denies N/V.

## 2017-11-16 NOTE — Discharge Instructions (Signed)
Take tylenol 2 pills 4 times a day and motrin 4 pills 3 times a day.  Drink plenty of fluids.  Return for worsening shortness of breath, headache, confusion. Follow up with your family doctor.   

## 2017-11-16 NOTE — ED Notes (Signed)
Patient given discharge instructions and verbalized understanding.  Patient stable to discharge at this time.  Patient is alert and oriented to baseline.  No distressed noted at this time.  All belongings taken with the patient at discharge.   

## 2017-11-16 NOTE — ED Provider Notes (Signed)
[facility] EMERGENCY DEPARTMENT Provider Note   CSN: [id number] Arrival date & time: 11/16/17  1422     History   Chief Complaint Chief Complaint  Patient presents with  . Near Syncope  . Generalized Body Aches    HPI Chavez Rosol is a 22 y.o. male.  22 yo M with a chief complaint of diffuse myalgias.  These seem to come and go been going on for the past couple days.  He also has had some mild diarrhea but denies any other symptoms.  He has not tried anything for this at home.  He has had to call out of work for the past couple days because he is felt so bad.  Has been able to eat and drink but he feels that it goes right through him.  Denies blood in stool or dark stool.  Denies suspicious food intake.  Denies sick contacts.  The history is provided by the patient.  Near Syncope  Pertinent negatives include no chest pain, no abdominal pain, no headaches and no shortness of breath.  Illness  This is a new problem. The current episode started 2 days ago. The problem occurs constantly. The problem has been gradually worsening. Pertinent negatives include no chest pain, no abdominal pain, no headaches and no shortness of breath.    Past Medical History:  Diagnosis Date  . Genital herpes   . Nail biting   . PAF (paroxysmal atrial fibrillation) (HCC)   . Pollen allergies     Patient Active Problem List   Diagnosis Date Noted  . URI (upper respiratory infection) 10/30/2016  . AF (paroxysmal atrial fibrillation) (HCC) 05/20/2016  . Throat discomfort 05/20/2016  . Tonsillolith 10/08/2015  . Health care maintenance 01/31/2015  . Systolic murmur 08/27/2014  . Palpitations 07/27/2014  . Lesion of penis 07/27/2014  . Risky sexual behavior 06/09/2013  . Allergic rhinitis 05/17/2013  . Skin irritation 01/08/2012    History reviewed. No pertinent surgical history.      Home Medications    Prior to Admission medications   Medication Sig Start Date End Date  Taking? Authorizing Provider  loratadine (CLARITIN) 10 MG tablet Take 1 tablet (10 mg total) by mouth daily. 10/30/16  Yes Marquette Saa, MD  benzonatate (TESSALON) 200 MG capsule Take 1 capsule (200 mg total) by mouth 3 (three) times daily as needed for cough. Patient not taking: Reported on 11/16/2017 10/30/16   Marquette Saa, MD  diltiazem (CARDIZEM CD) 120 MG 24 hr capsule Take 1 capsule (120 mg total) by mouth daily. Patient not taking: Reported on 06/03/2016 05/18/16   Leone Brand, NP    Family History Family History  Problem Relation Age of Onset  . Hypertension Maternal Grandmother   . Diabetes Maternal Grandmother     Social History Social History   Tobacco Use  . Smoking status: Never Smoker  . Smokeless tobacco: Never Used  Substance Use Topics  . Alcohol use: No  . Drug use: No     Allergies   Nickel and Pollen extract   Review of Systems Review of Systems  Constitutional: Negative for chills and fever.  HENT: Negative for congestion and facial swelling.   Eyes: Negative for discharge and visual disturbance.  Respiratory: Negative for shortness of breath.   Cardiovascular: Positive for near-syncope. Negative for chest pain and palpitations.  Gastrointestinal: Positive for diarrhea. Negative for abdominal pain and vomiting.  Musculoskeletal: Positive for myalgias. Negative for arthralgias.  Skin: Negative  for color change and rash.  Neurological: Negative for tremors, syncope and headaches.  Psychiatric/Behavioral: Negative for confusion and dysphoric mood.     Physical Exam Updated Vital Signs BP 125/79   Pulse 91   Temp (!) 100.4 F (38 C) (Oral)   Resp 20   Ht  (1.854 m)   Wt 63.5 kg (140 lb)   SpO2 100%   BMI 18.47 kg/m   Physical Exam  Constitutional: He is oriented to person, place, and time. He appears well-developed and well-nourished.  HENT:  Head: Normocephalic and atraumatic.   no noted sinus ttp, L tm  normal bilaterally R TM occluded by cerumen   Eyes: Pupils are equal, round, and reactive to light. EOM are normal.  Neck: Normal range of motion. Neck supple. No JVD present.  Cardiovascular: Normal rate and regular rhythm. Exam reveals no gallop and no friction rub.  No murmur heard. Pulmonary/Chest: No respiratory distress. He has no wheezes.  Abdominal: He exhibits no distension and no mass. There is no tenderness. There is no rebound and no guarding.  Musculoskeletal: Normal range of motion.  Neurological: He is alert and oriented to person, place, and time.  Odd affect  Skin: No rash noted. No pallor.  Psychiatric: He has a normal mood and affect. His behavior is normal.  Nursing note and vitals reviewed.    ED Treatments / Results  Labs (all labs ordered are listed, but only abnormal results are displayed) Labs Reviewed  BASIC METABOLIC PANEL - Abnormal; Notable for the following components:      Result Value   Sodium 133 (*)    Chloride 99 (*)    Glucose, Bld 117 (*)    All other components within normal limits  CBC - Abnormal; Notable for the following components:   WBC 10.7 (*)    All other components within normal limits  URINALYSIS, ROUTINE W REFLEX MICROSCOPIC - Abnormal; Notable for the following components:   Ketones, ur 20 (*)    Protein, ur 30 (*)    Bacteria, UA RARE (*)    All other components within normal limits  CBG MONITORING, ED    EKG EKG Interpretation  Date/Time:  Tuesday November 16 2017 14:36:28 EDT Ventricular Rate:  108 PR Interval:  144 QRS Duration: 92 QT Interval:  304 QTC Calculation: 407 R Axis:   69 Text Interpretation:  Sinus tachycardia Biatrial enlargement Nonspecific T wave abnormality Abnormal ECG No significant change since last tracing Confirmed by Melene Plan [phone]) on 11/16/2017 8:16:14 PM   Radiology No results found.  Procedures Procedures (including critical care time)  Medications Ordered in ED Medications    acetaminophen (TYLENOL) tablet 650 mg (650 mg Oral Given 11/16/17 1437)  ketorolac (TORADOL) injection 15 mg (15 mg Intramuscular Given 11/16/17 2118)     Initial Impression / Assessment and Plan / ED Course  I have reviewed the triage vital signs and the nursing notes.  Pertinent labs & imaging results that were available during my care of the patient were reviewed by me and considered in my medical decision making (see chart for details).     22 yo M with most likely a viral illness.  Diffuse myalgias and noted to be febrile here.  He has no meningeal signs has clear lung sounds TMs are normal and he has no ear pain.  No sinus tenderness to percussion.  The patient is somewhat odd in his affect though I feel this is unlikely to be encephalitis  and more likely to be drug-induced.  As he is well-appearing and nontoxic will not perform an LP.  Have him follow-up with his PCP.  9:24 PM:  I have discussed the diagnosis/risks/treatment options with the patient and family and believe the pt to be eligible for discharge home to follow-up with PCP. We also discussed returning to the ED immediately if new or worsening sx occur. We discussed the sx which are most concerning (e.g., sudden worsening pain, fever, inability to tolerate by mouth) that necessitate immediate return. Medications administered to the patient during their visit and any new prescriptions provided to the patient are listed below.  Medications given during this visit Medications  acetaminophen (TYLENOL) tablet 650 mg (650 mg Oral Given 11/16/17 1437)  ketorolac (TORADOL) injection 15 mg (15 mg Intramuscular Given 11/16/17 2118)    Labs reviewed ua negative, mild leukocytosis   The patient appears reasonably screen and/or stabilized for discharge and I doubt any other medical condition or other [facility] requiring further screening, evaluation, or treatment in the ED at this time prior to discharge.    Final Clinical Impressions(s) / ED  Diagnoses   Final diagnoses:  Influenza-like illness    ED Discharge Orders    None       Melene Plan, DO 11/16/17 2124

## 2018-04-14 ENCOUNTER — Encounter (HOSPITAL_COMMUNITY): Payer: Self-pay

## 2018-04-14 ENCOUNTER — Other Ambulatory Visit: Payer: Self-pay

## 2018-04-14 ENCOUNTER — Emergency Department (HOSPITAL_COMMUNITY)
Admission: EM | Admit: 2018-04-14 | Discharge: 2018-04-14 | Disposition: A | Discharge status: Home / Self Care | Payer: Self-pay | Attending: Emergency Medicine | Admitting: Emergency Medicine

## 2018-04-14 DIAGNOSIS — R002 Palpitations: Secondary | ICD-10-CM | POA: Insufficient documentation

## 2018-04-14 NOTE — ED Triage Notes (Signed)
Pt BIB [facility] for eval of palpitations. Pt reports episodic palpitations since earlier this evening. Reports being around those using marijuana but denies using it himself stating ""you know that secondhand smoke is really bad". Pt arrives in sinus rhythm, rate 70. Normotensive on arrival, GCS 15.

## 2018-04-14 NOTE — ED Provider Notes (Signed)
[facility] EMERGENCY DEPARTMENT Provider Note   CSN: [id number] Arrival date & time: 04/14/18  0053     History   Chief Complaint Chief Complaint  Patient presents with  . Palpitations    HPI Aaron Lutz is a 22 y.o. male.   22 year old male presents to the emergency department for evaluation of palpitations.  Characterizes his palpitations as a rapid heartbeat.  Highest heart rate with EMS was in the 80s.  Symptoms began around midnight.  Notes that he was around individuals smoking marijuana.  Denies any use of illicit substances himself.  Does endorse momentary episode of lightheadedness with his palpitations.  Patient did not experience any chest pain or syncope.  No family history of sudden cardiac death.  Denies any alcohol ingestion or similar symptoms in the past.  The history is provided by the patient. No language interpreter was used.  Palpitations      Past Medical History:  Diagnosis Date  . Genital herpes   . Nail biting   . PAF (paroxysmal atrial fibrillation) (HCC)   . Pollen allergies     Patient Active Problem List   Diagnosis Date Noted  . URI (upper respiratory infection) 10/30/2016  . AF (paroxysmal atrial fibrillation) (HCC) 05/20/2016  . Throat discomfort 05/20/2016  . Tonsillolith 10/08/2015  . Health care maintenance 01/31/2015  . Systolic murmur 08/27/2014  . Palpitations 07/27/2014  . Lesion of penis 07/27/2014  . Risky sexual behavior 06/09/2013  . Allergic rhinitis 05/17/2013  . Skin irritation 01/08/2012    History reviewed. No pertinent surgical history.      Home Medications    Prior to Admission medications   Medication Sig Start Date End Date Taking? Authorizing Provider  benzonatate (TESSALON) 200 MG capsule Take 1 capsule (200 mg total) by mouth 3 (three) times daily as needed for cough. Patient not taking: Reported on 11/16/2017 10/30/16   Marquette Saa, MD  diltiazem (CARDIZEM CD) 120 MG 24  hr capsule Take 1 capsule (120 mg total) by mouth daily. Patient not taking: Reported on 06/03/2016 05/18/16   Leone Brand, NP  loratadine (CLARITIN) 10 MG tablet Take 1 tablet (10 mg total) by mouth daily. Patient not taking: Reported on 04/14/2018 10/30/16   Marquette Saa, MD    Family History Family History  Problem Relation Age of Onset  . Hypertension Maternal Grandmother   . Diabetes Maternal Grandmother     Social History Social History   Tobacco Use  . Smoking status: Never Smoker  . Smokeless tobacco: Never Used  Substance Use Topics  . Alcohol use: No  . Drug use: No     Allergies   Nickel and Pollen extract   Review of Systems Review of Systems  Cardiovascular: Positive for palpitations.   Ten systems reviewed and are negative for acute change, except as noted in the HPI.    Physical Exam Updated Vital Signs BP 115/69 (BP Location: Right Arm)   Pulse 77   Temp 98 F (36.7 C) (Oral)   Resp 16   Ht 6\' 1"  (1.854 m)   Wt 59 kg   SpO2 100%   BMI 17.15 kg/m   Physical Exam  Constitutional: He is oriented to person, place, and time. He appears well-developed and well-nourished. No distress.  Nontoxic appearing and in no distress  HENT:  Head: Normocephalic and atraumatic.  Eyes: Conjunctivae and EOM are normal. No scleral icterus.  Neck: Normal range of motion.  Cardiovascular: Normal rate,  regular rhythm and intact distal pulses.  Pulmonary/Chest: Effort normal. No stridor. No respiratory distress. He has no wheezes. He has no rales.  Respirations even and unlabored.  Lungs clear to auscultation bilaterally  Musculoskeletal: Normal range of motion.  Neurological: He is alert and oriented to person, place, and time. He exhibits normal muscle tone. Coordination normal.  Skin: Skin is warm and dry. No rash noted. He is not diaphoretic. No erythema. No pallor.  Psychiatric: He has a normal mood and affect. His behavior is normal.  Nursing  note and vitals reviewed.    ED Treatments / Results  Labs (all labs ordered are listed, but only abnormal results are displayed) Labs Reviewed - No data to display  EKG None  Radiology No results found.  Procedures Procedures (including critical care time)  Medications Ordered in ED Medications - No data to display   Initial Impression / Assessment and Plan / ED Course  I have reviewed the triage vital signs and the nursing notes.  Pertinent labs & imaging results that were available during my care of the patient were reviewed by me and considered in my medical decision making (see chart for details).     22 year old male presents for palpitations in the setting of marijuana exposure.  States that he felt as though his heart was beating rapidly, but never had a heart rate above 80s with EMS.  Has been in normal sinus rhythm since arrival.  EKG without concerning features.  Orthostatic vital signs are stable.  Patient states that his palpitations have greatly improved, largely subsided, since removing himself from an environment with marijuana.  Suspect symptoms to be substance-induced.  I do not believe further emergent work-up is indicated.  Return precautions discussed and provided. Patient discharged in stable condition with no unaddressed concerns.   Final Clinical Impressions(s) / ED Diagnoses   Final diagnoses:  Palpitations    ED Discharge Orders    None       Antony Madura, PA-C 04/14/18 0259    Dione Booze, MD 04/14/18 [phone]

## 2018-04-14 NOTE — ED Notes (Signed)
No assistance needed to ortho VS. Steady and strong on feet. Reports no light-headed feelings.

## 2019-08-04 ENCOUNTER — Other Ambulatory Visit: Payer: Self-pay

## 2019-10-02 ENCOUNTER — Ambulatory Visit: Payer: Self-pay

## 2020-06-21 ENCOUNTER — Other Ambulatory Visit: Payer: Self-pay

## 2020-06-21 ENCOUNTER — Encounter (HOSPITAL_COMMUNITY): Payer: Self-pay | Admitting: Emergency Medicine

## 2020-06-21 ENCOUNTER — Ambulatory Visit (HOSPITAL_COMMUNITY)
Admission: EM | Admit: 2020-06-21 | Discharge: 2020-06-21 | Disposition: A | Discharge status: Home / Self Care | Payer: Medicaid Other | Attending: Internal Medicine | Admitting: Internal Medicine

## 2020-06-21 DIAGNOSIS — R0981 Nasal congestion: Secondary | ICD-10-CM

## 2020-06-21 DIAGNOSIS — Z20822 Contact with and (suspected) exposure to covid-19: Secondary | ICD-10-CM | POA: Insufficient documentation

## 2020-06-21 DIAGNOSIS — J029 Acute pharyngitis, unspecified: Secondary | ICD-10-CM

## 2020-06-21 LAB — POCT RAPID STREP A, ED / UC: Streptococcus, Group A Screen (Direct): NEGATIVE

## 2020-06-21 NOTE — ED Provider Notes (Signed)
____________________________________________  Time seen: Approximately 4:27 PM  I have reviewed the triage vital signs and the nursing notes.   HISTORY  Chief Complaint Sore Throat and Nasal Congestion   Historian Patient    HPI Aaron Lutz is a 24 y.o. male presents to the urgent care with pharyngitis and nasal congestion for the past 4 days.  Patient is able to manage his own secretions and can speak in complete sentences.  No rhinorrhea or nonproductive cough.  Patient denies fever and chills at home.  Patient states that he wants to just be cautious and be tested for COVID-19.  He works at a [employer] and has numerous potential sick contacts.  No other alleviating measures have been attempted.   Past Medical History:  Diagnosis Date   Genital herpes    Nail biting    PAF (paroxysmal atrial fibrillation) (HCC)    Pollen allergies      Immunizations up to date:  Yes.     Past Medical History:  Diagnosis Date   Genital herpes    Nail biting    PAF (paroxysmal atrial fibrillation) (HCC)    Pollen allergies     Patient Active Problem List   Diagnosis Date Noted   URI (upper respiratory infection) 10/30/2016   AF (paroxysmal atrial fibrillation) (HCC) 05/20/2016   Throat discomfort 05/20/2016   Tonsillolith 10/08/2015   Health care maintenance 01/31/2015   Systolic murmur 08/27/2014   Palpitations 07/27/2014   Lesion of penis 07/27/2014   Risky sexual behavior 06/09/2013   Allergic rhinitis 05/17/2013   Skin irritation 01/08/2012    History reviewed. No pertinent surgical history.  Prior to Admission medications   Medication Sig Start Date End Date Taking? Authorizing Provider  benzonatate (TESSALON) 200 MG capsule Take 1 capsule (200 mg total) by mouth 3 (three) times daily as needed for cough. Patient not taking: Reported on 11/16/2017 10/30/16   Marquette Saa, MD  diltiazem (CARDIZEM CD) 120 MG 24 hr capsule Take 1  capsule (120 mg total) by mouth daily. Patient not taking: Reported on 06/03/2016 05/18/16   Leone Brand, NP  loratadine (CLARITIN) 10 MG tablet Take 1 tablet (10 mg total) by mouth daily. Patient not taking: Reported on 04/14/2018 10/30/16   Marquette Saa, MD    Allergies Nickel and Pollen extract  Family History  Problem Relation Age of Onset   Hypertension Maternal Grandmother    Diabetes Maternal Grandmother     Social History Social History   Tobacco Use   Smoking status: Never Smoker   Smokeless tobacco: Never Used  Substance Use Topics   Alcohol use: No   Drug use: No     Review of Systems  Constitutional: No fever/chills Eyes:  No discharge ENT: Patient has pharyngitis.  Respiratory: no cough. No SOB/ use of accessory muscles to breath Gastrointestinal:   No nausea, no vomiting.  No diarrhea.  No constipation. Musculoskeletal: Negative for musculoskeletal pain. Skin: Negative for rash, abrasions, lacerations, ecchymosis.   ____________________________________________   PHYSICAL EXAM:  VITAL SIGNS: ED Triage Vitals  Enc Vitals Group     BP 06/21/20 1607 130/68     Pulse Rate 06/21/20 1607 68     Resp 06/21/20 1607 17     Temp 06/21/20 1607 98.3 F (36.8 C)     Temp Source 06/21/20 1607 Oral     SpO2 06/21/20 1607 100 %     Weight --      Height --  Head Circumference --      Peak Flow --      Pain Score 06/21/20 1605 0     Pain Loc --      Pain Edu? --      Excl. in GC? --      Constitutional: Alert and oriented. Well appearing and in no acute distress. Eyes: Conjunctivae are normal. PERRL. EOMI. Head: Atraumatic. ENT:      Nose: No congestion/rhinnorhea.      Mouth/Throat: Mucous membranes are moist.  Posterior pharynx is mildly erythematous.  No significant tonsillar hypertrophy.  No exudate.  Uvula is midline Neck: No stridor.  No cervical spine tenderness to palpation. Hematological/Lymphatic/Immunilogical:  Palpable cervical lymphadenopathy.  Cardiovascular: Normal rate, regular rhythm. Normal S1 and S2.  Good peripheral circulation. Respiratory: Normal respiratory effort without tachypnea or retractions. Lungs CTAB. Good air entry to the bases with no decreased or absent breath sounds Skin:  Skin is warm, dry and intact. No rash noted. Psychiatric: Mood and affect are normal for age. Speech and behavior are normal.   ____________________________________________   LABS (all labs ordered are listed, but only abnormal results are displayed)  Labs Reviewed  SARS CORONAVIRUS 2 (TAT 6-24 HRS)   ____________________________________________  EKG   ____________________________________________  RADIOLOGY   No results found.  ____________________________________________    PROCEDURES  Procedure(s) performed:     Procedures     Medications - No data to display   ____________________________________________   INITIAL IMPRESSION / ASSESSMENT AND PLAN / ED COURSE  Pertinent labs & imaging results that were available during my care of the patient were reviewed by me and considered in my medical decision making (see chart for details).      Assessment and Plan: Pharyngitis 24 year old male presents to the urgent care with pharyngitis and nasal congestion for the past 4 days.  Vital signs are reassuring at triage.  On physical exam, patient was able to speak in complete sentences and can manage his own secretions.  Posterior pharynx was mildly erythematous without significant tonsillar hypertrophy or exudate.  Differential diagnosis includes group A strep, COVID-19 and unspecified viral pharyngitis.  Patient feels comfortable awaiting results for COVID-19 at home.  A work note was provided.  Recommended continuing Tylenol and ibuprofen alternating for pharyngitis.  All patient questions were answered.    ____________________________________________  FINAL CLINICAL  IMPRESSION(S) / ED DIAGNOSES  Final diagnoses:  Acute pharyngitis, unspecified etiology      NEW MEDICATIONS STARTED DURING THIS VISIT:  ED Discharge Orders    None          This chart was dictated using voice recognition software/Dragon. Despite best efforts to proofread, errors can occur which can change the meaning. Any change was purely unintentional.     Orvil Feil, PA-C 06/21/20 1630

## 2020-06-21 NOTE — ED Triage Notes (Signed)
Pt presents with sore throat and nasal congestion xs 4 days.

## 2020-06-22 LAB — SARS CORONAVIRUS 2 (TAT 6-24 HRS): SARS Coronavirus 2: NEGATIVE

## 2020-06-24 LAB — CULTURE, GROUP A STREP (THRC)

## 2020-08-17 ENCOUNTER — Emergency Department (HOSPITAL_COMMUNITY): Payer: Medicaid Other

## 2020-08-17 ENCOUNTER — Other Ambulatory Visit: Payer: Self-pay

## 2020-08-17 ENCOUNTER — Encounter (HOSPITAL_COMMUNITY): Payer: Self-pay | Admitting: Emergency Medicine

## 2020-08-17 ENCOUNTER — Emergency Department (HOSPITAL_COMMUNITY)
Admission: EM | Admit: 2020-08-17 | Discharge: 2020-08-17 | Disposition: A | Discharge status: Home / Self Care | Payer: Medicaid Other | Attending: Emergency Medicine | Admitting: Emergency Medicine

## 2020-08-17 DIAGNOSIS — Z20822 Contact with and (suspected) exposure to covid-19: Secondary | ICD-10-CM | POA: Insufficient documentation

## 2020-08-17 DIAGNOSIS — I4891 Unspecified atrial fibrillation: Secondary | ICD-10-CM

## 2020-08-17 DIAGNOSIS — I480 Paroxysmal atrial fibrillation: Secondary | ICD-10-CM | POA: Insufficient documentation

## 2020-08-17 LAB — BASIC METABOLIC PANEL
Anion gap: 14 (ref 5–15)
BUN: 8 mg/dL (ref 6–20)
CO2: 22 mmol/L (ref 22–32)
Calcium: 9.8 mg/dL (ref 8.9–10.3)
Chloride: 104 mmol/L (ref 98–111)
Creatinine, Ser: 1.05 mg/dL (ref 0.61–1.24)
GFR, Estimated: >60 mL/min (ref >60)
Glucose, Bld: 90 mg/dL (ref 70–99)
Potassium: 3.4 mmol/L — ABNORMAL LOW (ref 3.5–5.1)
Sodium: 140 mmol/L (ref 135–145)

## 2020-08-17 LAB — CBC
HCT: 51.6 % (ref 39.0–52.0)
Hemoglobin: 18 g/dL — ABNORMAL HIGH (ref 13.0–17.0)
MCH: 31 pg (ref 26.0–34.0)
MCHC: 34.9 g/dL (ref 30.0–36.0)
MCV: 88.8 fL (ref 80.0–100.0)
Platelets: 353 10*3/uL (ref 150–400)
RBC: 5.81 MIL/uL (ref 4.22–5.81)
RDW: 13 % (ref 11.5–15.5)
WBC: 6.4 10*3/uL (ref 4.0–10.5)
nRBC: 0 % (ref 0.0–0.2)

## 2020-08-17 LAB — TROPONIN I (HIGH SENSITIVITY)
Troponin I (High Sensitivity): 4 ng/L (ref <18)
Troponin I (High Sensitivity): 4 ng/L (ref <18)

## 2020-08-17 LAB — SARS CORONAVIRUS 2 BY RT PCR (HOSPITAL ORDER, PERFORMED IN ~~LOC~~ HOSPITAL LAB): SARS Coronavirus 2: NEGATIVE

## 2020-08-17 LAB — MAGNESIUM: Magnesium: 2.3 mg/dL (ref 1.7–2.4)

## 2020-08-17 LAB — TSH: TSH: 2.686 u[IU]/mL (ref 0.350–4.500)

## 2020-08-17 MED ORDER — SODIUM CHLORIDE 0.9 % IV BOLUS
1000.0000 mL | Freq: Once | INTRAVENOUS | Status: AC
  Administered 2020-08-17: 1000 mL via INTRAVENOUS

## 2020-08-17 MED ORDER — DILTIAZEM HCL 25 MG/5ML IV SOLN
10.0000 mg | Freq: Once | INTRAVENOUS | Status: AC
  Administered 2020-08-17: 10 mg via INTRAVENOUS
  Filled 2020-08-17: qty 5

## 2020-08-17 MED ORDER — PROPOFOL 10 MG/ML IV BOLUS
0.5000 mg/kg | Freq: Once | INTRAVENOUS | Status: DC
  Filled 2020-08-17: qty 20

## 2020-08-17 MED ORDER — RIVAROXABAN 20 MG PO TABS
20.0000 mg | ORAL_TABLET | Freq: Every day | ORAL | Status: DC
  Administered 2020-08-17: 20 mg via ORAL
  Filled 2020-08-17: qty 1

## 2020-08-17 MED ORDER — RIVAROXABAN 20 MG PO TABS: 20.0000 mg | ORAL_TABLET | Freq: Every day | ORAL | 0 refills | Status: AC

## 2020-08-17 MED ORDER — PROPOFOL 10 MG/ML IV BOLUS
INTRAVENOUS | Status: AC | PRN
  Administered 2020-08-17: 30 mg via INTRAVENOUS
  Administered 2020-08-17: 20 mg via INTRAVENOUS

## 2020-08-17 NOTE — Discharge Instructions (Addendum)
et help right away if you have: Chest pain, abdominal pain, sweating, or weakness. Trouble breathing. Side effects of blood thinners, such as blood in your vomit, stool, or urine, or bleeding that cannot stop. Any symptoms of a stroke. "BE FAST" is an easy way to remember the main warning signs of a stroke: B - Balance. Signs are dizziness, sudden trouble walking, or loss of balance. E - Eyes. Signs are trouble seeing or a sudden change in vision. F - Face. Signs are sudden weakness or numbness of the face, or the face or eyelid drooping on one side. A - Arms. Signs are weakness or numbness in an arm. This happens suddenly and usually on one side of the body. S - Speech. Signs are sudden trouble speaking, slurred speech, or trouble understanding what people say. T - Time. Time to call emergency services. Write down what time symptoms started. Other signs of a stroke, such as: A sudden, severe headache with no known cause. Nausea or vomiting. Seizure. These symptoms may represent a serious problem that is an emergency. Do not wait to see if the symptoms will go away. Get medical help right away. Call your local emergency services (911 in the [place] Do not drive yourself to the hospital.

## 2020-08-17 NOTE — ED Triage Notes (Signed)
Reports irregular heartbeat, SOB, and chest pain since 4am.  History of Afib.  States he used marijuana this morning.  Not currently taking any daily meds.  Took 2 baby ASA prior to arrival.

## 2020-08-17 NOTE — ED Provider Notes (Signed)
[facility] EMERGENCY DEPARTMENT Provider Note   CSN: [id number] Arrival date & time: 08/17/20  9381     History Chief Complaint  Patient presents with  . Tachycardia    Aaron Lutz is a 25 y.o. male.  With a past medical history of paroxysmal atrial fibrillation beginning when he was 25 years old who presents emergency department with chief complaint of A. fib with RVR.  Patient states that he was up with his son who has a cold at 4:00 in the morning he noticed his heart began racing and palpitating.  He was in a normal rhythm prior to this.  He denies chest pain or shortness of breath.  He has tried breathing techniques to try and slow his heart rate.  He denies any alcohol or drug abuse however does admit to smoking Marijuana.  He has had no recent illnesses.  He denies cough, fever, shortness of breath.  He took 2 baby aspirin prior to arrival but does not regularly use anticoagulation.  He has seen cardiology in the past and has had cardiac monitoring.  HPI     Past Medical History:  Diagnosis Date  . Genital herpes   . Nail biting   . PAF (paroxysmal atrial fibrillation) (HCC)   . Pollen allergies     Patient Active Problem List   Diagnosis Date Noted  . URI (upper respiratory infection) 10/30/2016  . AF (paroxysmal atrial fibrillation) (HCC) 05/20/2016  . Throat discomfort 05/20/2016  . Tonsillolith 10/08/2015  . Health care maintenance 01/31/2015  . Systolic murmur 08/27/2014  . Palpitations 07/27/2014  . Lesion of penis 07/27/2014  . Risky sexual behavior 06/09/2013  . Allergic rhinitis 05/17/2013  . Skin irritation 01/08/2012    History reviewed. No pertinent surgical history.     Family History  Problem Relation Age of Onset  . Hypertension Maternal Grandmother   . Diabetes Maternal Grandmother     Social History   Tobacco Use  . Smoking status: Never Smoker  . Smokeless tobacco: Never Used  Substance Use Topics  . Alcohol use: No   . Drug use: Yes    Types: Marijuana    Home Medications Prior to Admission medications   Medication Sig Start Date End Date Taking? Authorizing Provider  rivaroxaban (XARELTO) 20 MG TABS tablet Take 1 tablet (20 mg total) by mouth daily with supper. 08/17/20  Yes Ludia Gartland, PA-C  benzonatate (TESSALON) 200 MG capsule Take 1 capsule (200 mg total) by mouth 3 (three) times daily as needed for cough. Patient not taking: Reported on 11/16/2017 10/30/16   Marquette Saa, MD  diltiazem (CARDIZEM CD) 120 MG 24 hr capsule Take 1 capsule (120 mg total) by mouth daily. Patient not taking: Reported on 06/03/2016 05/18/16   Leone Brand, NP  loratadine (CLARITIN) 10 MG tablet Take 1 tablet (10 mg total) by mouth daily. Patient not taking: Reported on 04/14/2018 10/30/16   Marquette Saa, MD    Allergies    Nickel and Pollen extract  Review of Systems   Review of Systems Ten systems reviewed and are negative for acute change, except as noted in the HPI.   Physical Exam Updated Vital Signs BP 121/66   Pulse (!) 140   Temp 97.9 F (36.6 C) (Oral)   Resp 17   Wt 63.5 kg   SpO2 100%   BMI 18.47 kg/m   Physical Exam Vitals and nursing note reviewed.  Constitutional:      General:  He is not in acute distress.    Appearance: He is well-developed and well-nourished. He is not diaphoretic.  HENT:     Head: Normocephalic and atraumatic.  Eyes:     General: No scleral icterus.    Conjunctiva/sclera: Conjunctivae normal.  Cardiovascular:     Rate and Rhythm: Tachycardia present. Rhythm regularly irregular.     Heart sounds: Normal heart sounds.  Pulmonary:     Effort: Pulmonary effort is normal. No respiratory distress.     Breath sounds: Normal breath sounds.  Abdominal:     Palpations: Abdomen is soft.     Tenderness: There is no abdominal tenderness.  Musculoskeletal:        General: No edema.     Cervical back: Normal range of motion and neck supple.   Skin:    General: Skin is warm and dry.  Neurological:     Mental Status: He is alert.  Psychiatric:        Mood and Affect: Mood is anxious.        Behavior: Behavior normal.     ED Results / Procedures / Treatments   Labs (all labs ordered are listed, but only abnormal results are displayed) Labs Reviewed  BASIC METABOLIC PANEL - Abnormal; Notable for the following components:      Result Value   Potassium 3.4 (*)    All other components within normal limits  CBC - Abnormal; Notable for the following components:   Hemoglobin 18.0 (*)    All other components within normal limits  SARS CORONAVIRUS 2 BY RT PCR (HOSPITAL ORDER, PERFORMED IN [facility])  MAGNESIUM  TSH  TROPONIN I (HIGH SENSITIVITY)  TROPONIN I (HIGH SENSITIVITY)    EKG EKG Interpretation  Date/Time:  Saturday August 17 2020 14:05:25 EST Ventricular Rate:  85 PR Interval:    QRS Duration: 104 QT Interval:  366 QTC Calculation: 436 R Axis:   17 Text Interpretation: Sinus rhythm RAE, consider biatrial enlargement RSR' in V1 or V2, right VCD or RVH ST elev, probable normal early repol pattern Atrial fibrillation RESOLVED SINCE PREVIOUS Confirmed by Gwyneth Sprout ([id number]) on 08/17/2020 2:08:46 PM   Radiology DG Chest Port 1 View  Result Date: 08/17/2020 CLINICAL DATA:  Atrial fibrillation, irregular heartbeat, shortness of breath and chest pain since 4 a.m. EXAM: PORTABLE CHEST 1 VIEW COMPARISON:  Chest x-ray dated 05/09/2016 FINDINGS: Heart size and mediastinal contours are within normal limits. Lungs are clear. Stable prominent scoliosis of the thoracolumbar spine. No acute appearing osseous abnormality. IMPRESSION: No active disease. No evidence of pneumonia or pulmonary edema. Electronically Signed   By: Bary Richard M.D.   On: 08/17/2020 10:09    Procedures Procedures  .   Medications Ordered in ED Medications  propofol (DIPRIVAN) 10 mg/mL bolus/IV push 31.8 mg (has no  administration in time range)  rivaroxaban (XARELTO) tablet 20 mg (has no administration in time range)  diltiazem (CARDIZEM) injection 10 mg (10 mg Intravenous Given 08/17/20 1018)  sodium chloride 0.9 % bolus 1,000 mL (0 mLs Intravenous Stopped 08/17/20 1351)  propofol (DIPRIVAN) 10 mg/mL bolus/IV push (20 mg Intravenous Given 08/17/20 1403)    ED Course  I have reviewed the triage vital signs and the nursing notes.  Pertinent labs & imaging results that were available during my care of the patient were reviewed by me and considered in my medical decision making (see chart for details).  Clinical Course as of 08/17/20 1451  Sat Aug 17, 2020  1119 Patient now rate controlled between 70-100. Still in Afib  [AH]  1124 I spoke with Dr. Elberta Fortis. He suggests cardioversion. He states that the patient should also be anticoagulated on Eliquis and close follow-up in the A. fib clinic. [AH]    Clinical Course User Index [AH] Arthor Captain, PA-C   MDM Rules/Calculators/A&P     CHA2DS2-VASc Score: 0                     CC: Palpitations/A. fib VS: 2:51 PM Cardiac monitoring reveals Sinus rhythm at a rate of 72 , as reviewed and interpreted by me. Cardiac monitoring was ordered due to afib w/ rvr and to monitor patient for dysrhythmia.  TS:VXBLTJQ is gathered by patient and EMR. Previous records obtained and reviewed. DDX:The patient's complaint of palpitations involves an extensive number of diagnostic and treatment options, and is a complaint that carries with it a high risk of complications, morbidity, and potential mortality. Given the large differential diagnosis, medical decision making is of high complexity. The differential diagnosis for palpitations includes cardiac arrhythmias, PVC/PAC, ACS, Cardiomyopathy, CHF, MVP, pericarditis, valvular disease, Panic/Anxiety, Somatic disorder, ETOH, Caffeine,  Stimulant use, medication side effect, Anemia, Hyperthyroidism, pulmonary embolism.  Labs: I  ordered reviewed and interpreted labs which include CBC which shows hemoglobin of 18 may represent some mild dehydration.  BMP shows mild hypokalemia of insignificant value, mag and troponin within normal limits, TSH within normal limits and patient's Covid test is negative. Imaging: I ordered and reviewed images which included portable 1 view chest x-ray. I independently visualized and interpreted all imaging. There are no acute, significant findings on today's images. EKG: Initial EKG shows A. fib with RVR at a rate of 166.  Repeat EKG at discharge shows normal sinus rhythm at a rate of 85 Consults: Case discussed with Dr. Loman Brooklyn who recommends ca.  Patient appears otherwise appropriate for discharge rdioversion MDM: Patient here with recurrent episode of A. fib with RVR.  The patient's heart chads score is 0 however Dr. Elberta Fortis recommends Xarelto after anticoagulation.  We were all set up to anticoagulate and patient got a couple doses of propofol however he spontaneously converted back to sinus rhythm during the procedure and did not have to receive cardioversion.  The patient has maintained a heart rate of normal sinus rhythm since that time.  We will continue with anticoagulation as discussed with close outpatient follow-up in the A. fib clinic.  Discussed return precautions Patient disposition:The patient appears reasonably screened and/or stabilized for discharge and I doubt any other medical condition or other [facility] requiring further screening, evaluation, or treatment in the ED at this time prior to discharge. I have discussed lab and/or imaging findings with the patient and answered all questions/concerns to the best of my ability.I have discussed return precautions and OP follow up.    Final Clinical Impression(s) / ED Diagnoses Final diagnoses:  Atrial fibrillation with rapid ventricular response [facility])    Rx / DC Orders ED Discharge Orders         Ordered    Amb referral to AFIB  Clinic        08/17/20 0946    rivaroxaban (XARELTO) 20 MG TABS tablet  Daily with supper        08/17/20 1421           Arthor Captain, PA-C 08/17/20 1453    Gwyneth Sprout, MD 08/18/20 [phone]

## 2020-08-23 ENCOUNTER — Ambulatory Visit (HOSPITAL_COMMUNITY): Payer: Medicaid Other | Admitting: Physician Assistant

## 2020-08-23 ENCOUNTER — Encounter (HOSPITAL_COMMUNITY): Payer: Self-pay | Admitting: *Deleted

## 2020-08-23 NOTE — Progress Notes (Incomplete)
Primary Care Physician: Patient, No Pcp Per Primary Cardiologist: none Primary Electrophysiologist: Dr Elberta Fortis (remotely) Referring Physician: [facility]   Aaron Lutz is a 25 y.o. male with a history of atrial fibrillation who presents for consultation in the [facility] Atrial Fibrillation [facility].  The patient was initially diagnosed with atrial fibrillation in 2017 after presenting to the ED with symptoms of heart racing and CP. He converted to SR not long after arrival. He was seen by Dr Elberta Fortis and started on diltiazem. Patient is not on anticoagulation for a CHADS2VASC score of 0. He was seen again at the ED 08/17/20 with heart racing and found to be in rapid afib. Decision was made to have DCCV but he converted to SR as soon as propofol was administered. ***  Today, he denies symptoms of ***palpitations, chest pain, shortness of breath, orthopnea, PND, lower extremity edema, dizziness, presyncope, syncope, snoring, daytime somnolence, bleeding, or neurologic sequela. The patient is tolerating medications without difficulties and is otherwise without complaint today.    Atrial Fibrillation Risk Factors:  he {Action; does/does not:19097} have symptoms or diagnosis of sleep apnea. he {ACTION; IS/IS [id number]} compliant with CPAP therapy. he {Action; does/does not:19097} have a history of rheumatic fever. he {Action; does/does not:19097} have a history of alcohol use. The patient {Action; does/does not:19097} have a history of early familial atrial fibrillation or other arrhythmias.  he has a BMI of There is no height or weight on file to calculate BMI.. There were no vitals filed for this visit.  Family History  Problem Relation Age of Onset  . Hypertension Maternal Grandmother   . Diabetes Maternal Grandmother      Atrial Fibrillation Management history:  Previous antiarrhythmic drugs: none Previous cardioversions: none Previous ablations: none CHADS2VASC score:  0 Anticoagulation history: Xarelto    Past Medical History:  Diagnosis Date  . Genital herpes   . Nail biting   . PAF (paroxysmal atrial fibrillation) (HCC)   . Pollen allergies    No past surgical history on file.  Current Outpatient Medications  Medication Sig Dispense Refill  . benzonatate (TESSALON) 200 MG capsule Take 1 capsule (200 mg total) by mouth 3 (three) times daily as needed for cough. (Patient not taking: Reported on 11/16/2017) 30 capsule 0  . diltiazem (CARDIZEM CD) 120 MG 24 hr capsule Take 1 capsule (120 mg total) by mouth daily. (Patient not taking: Reported on 06/03/2016) 30 capsule 6  . loratadine (CLARITIN) 10 MG tablet Take 1 tablet (10 mg total) by mouth daily. (Patient not taking: Reported on 04/14/2018) 30 tablet 11  . rivaroxaban (XARELTO) 20 MG TABS tablet Take 1 tablet (20 mg total) by mouth daily with supper. 30 tablet 0   No current facility-administered medications for this visit.    Allergies  Allergen Reactions  . Nickel Other (See Comments)    Skin reaction  . Pollen Extract Other (See Comments)    Seasonal allergies     Social History   Socioeconomic History  . Marital status: Single    Spouse name: Not on file  . Number of children: Not on file  . Years of education: Not on file  . Highest education level: Not on file  Occupational History  . Not on file  Tobacco Use  . Smoking status: Never Smoker  . Smokeless tobacco: Never Used  Substance and Sexual Activity  . Alcohol use: No  . Drug use: Yes    Types: Marijuana  . Sexual activity:  Yes  Other Topics Concern  . Not on file  Social History Narrative  . Not on file   Social Determinants of Health   Financial Resource Strain: Not on file  Food Insecurity: Not on file  Transportation Needs: Not on file  Physical Activity: Not on file  Stress: Not on file  Social Connections: Not on file  Intimate Partner Violence: Not on file     ROS- All systems are reviewed and  negative except as per the HPI above.  Physical Exam: There were no vitals filed for this visit.  GEN- The patient is well appearing ***, alert and oriented x 3 today.   Head- normocephalic, atraumatic Eyes-  Sclera clear, conjunctiva pink Ears- hearing intact Oropharynx- clear Neck- supple  Lungs- Clear to ausculation bilaterally, normal work of breathing Heart- ***Regular rate and rhythm, no murmurs, rubs or gallops  GI- soft, NT, ND, + BS Extremities- no clubbing, cyanosis, or edema MS- no significant deformity or atrophy Skin- no rash or lesion Psych- euthymic mood, full affect Neuro- strength and sensation are intact  Wt Readings from Last 3 Encounters:  08/17/20 63.5 kg  04/14/18 59 kg  11/16/17 63.5 kg    EKG today demonstrates ***  Echo 08/28/14 demonstrated  - Left ventricle: The cavity size was normal. Wall thickness was  normal. Systolic function was vigorous. The estimated ejection  fraction was in the range of 65% to 70%. Wall motion was normal;  there were no regional wall motion abnormalities. Left  ventricular diastolic function parameters were normal.  - Left atrium: The atrium was normal in size.   Impressions:   - Normal study.    Epic records are reviewed at length today  CHA2DS2-VASc Score = 0  The patient's score is based upon: CHF History: No HTN History: No Diabetes History: No Stroke History: No Vascular Disease History: No Age Score: 0 Gender Score: 0  {Click here to calculate score.  REFRESH note before signing.       :[id number]}    ASSESSMENT AND PLAN: 1. Paroxysmal Atrial Fibrillation (ICD10:  I48.0) The patient's CHA2DS2-VASc score is 0, indicating a 0.2% annual risk of stroke.   ***camnitz ablation  Check echocardiogram Continue diltiazem 120 mg daily Continue Xarelto 20 mg daily for now.   Follow up ***   [name] PA-C Afib Clinic [name] [address] [place], [place]  [address] [phone] 08/23/2020 9:30 AM

## 2020-09-19 ENCOUNTER — Ambulatory Visit (HOSPITAL_COMMUNITY)
Admission: EM | Admit: 2020-09-19 | Discharge: 2020-09-19 | Disposition: A | Discharge status: Home / Self Care | Payer: Self-pay | Attending: Emergency Medicine | Admitting: Emergency Medicine

## 2020-09-19 ENCOUNTER — Other Ambulatory Visit: Payer: Self-pay

## 2020-09-19 ENCOUNTER — Ambulatory Visit (INDEPENDENT_AMBULATORY_CARE_PROVIDER_SITE_OTHER): Payer: Self-pay

## 2020-09-19 ENCOUNTER — Encounter (HOSPITAL_COMMUNITY): Payer: Self-pay | Admitting: Emergency Medicine

## 2020-09-19 DIAGNOSIS — M79671 Pain in right foot: Secondary | ICD-10-CM

## 2020-09-19 NOTE — ED Triage Notes (Addendum)
Pt presents with right foot pain xs 1 week. States pain has not subsided over the past week.   States went to get up from the floor and over extended his foot.

## 2020-09-19 NOTE — ED Provider Notes (Signed)
[facility]    CSN: [id number] Arrival date & time: 09/19/20  1624      History   Chief Complaint Chief Complaint  Patient presents with  . Foot Pain    HPI Aaron Lutz is a 25 y.o. male.   Patient presents with 1 week history of pain in his right foot.  He states he "overextended" it when he was trying to get up off the floor.  He denies numbness, weakness, paresthesias, open wounds, redness, bruising, wounds, or other symptoms.  No treatment attempted at home.  His medical history includes paroxysmal atrial fibrillation, systolic murmur, HSV, allergic rhinitis.  The history is provided by the patient and medical records.    Past Medical History:  Diagnosis Date  . Genital herpes   . Nail biting   . PAF (paroxysmal atrial fibrillation) (HCC)   . Pollen allergies     Patient Active Problem List   Diagnosis Date Noted  . URI (upper respiratory infection) 10/30/2016  . AF (paroxysmal atrial fibrillation) (HCC) 05/20/2016  . Throat discomfort 05/20/2016  . Tonsillolith 10/08/2015  . Health care maintenance 01/31/2015  . Systolic murmur 08/27/2014  . Palpitations 07/27/2014  . Lesion of penis 07/27/2014  . Risky sexual behavior 06/09/2013  . Allergic rhinitis 05/17/2013  . Skin irritation 01/08/2012    History reviewed. No pertinent surgical history.     Home Medications    Prior to Admission medications   Medication Sig Start Date End Date Taking? Authorizing Provider  benzonatate (TESSALON) 200 MG capsule Take 1 capsule (200 mg total) by mouth 3 (three) times daily as needed for cough. Patient not taking: Reported on 11/16/2017 10/30/16   Marquette Saa, MD  diltiazem (CARDIZEM CD) 120 MG 24 hr capsule Take 1 capsule (120 mg total) by mouth daily. Patient not taking: Reported on 06/03/2016 05/18/16   Leone Brand, NP  loratadine (CLARITIN) 10 MG tablet Take 1 tablet (10 mg total) by mouth daily. Patient not taking: Reported on 04/14/2018  10/30/16   Marquette Saa, MD  rivaroxaban (XARELTO) 20 MG TABS tablet Take 1 tablet (20 mg total) by mouth daily with supper. 08/17/20   Arthor Captain, PA-C    Family History Family History  Problem Relation Age of Onset  . Hypertension Maternal Grandmother   . Diabetes Maternal Grandmother     Social History Social History   Tobacco Use  . Smoking status: Never Smoker  . Smokeless tobacco: Never Used  Substance Use Topics  . Alcohol use: No  . Drug use: Yes    Types: Marijuana     Allergies   Nickel and Pollen extract   Review of Systems Review of Systems  Constitutional: Negative for chills and fever.  HENT: Negative for ear pain and sore throat.   Eyes: Negative for pain and visual disturbance.  Respiratory: Negative for cough and shortness of breath.   Cardiovascular: Negative for chest pain and palpitations.  Gastrointestinal: Negative for abdominal pain and vomiting.  Genitourinary: Negative for dysuria and hematuria.  Musculoskeletal: Positive for arthralgias. Negative for back pain and joint swelling.  Skin: Negative for color change and rash.  Neurological: Negative for syncope, weakness and numbness.  All other systems reviewed and are negative.    Physical Exam Triage Vital Signs ED Triage Vitals  Enc Vitals Group     BP      Pulse      Resp      Temp  Temp src      SpO2      Weight      Height      Head Circumference      Peak Flow      Pain Score      Pain Loc      Pain Edu?      Excl. in GC?    No data found.  Updated Vital Signs BP 115/65 (BP Location: Left Arm)   Pulse 67   Temp 98.4 F (36.9 C) (Oral)   Resp 17   SpO2 100%   Visual Acuity Right Eye Distance:   Left Eye Distance:   Bilateral Distance:    Right Eye Near:   Left Eye Near:    Bilateral Near:     Physical Exam Vitals and nursing note reviewed.  Constitutional:      General: He is not in acute distress.    Appearance: He is well-developed  and well-nourished. He is not ill-appearing.  HENT:     Head: Normocephalic and atraumatic.     Mouth/Throat:     Mouth: Mucous membranes are moist.  Eyes:     Conjunctiva/sclera: Conjunctivae normal.  Cardiovascular:     Rate and Rhythm: Normal rate and regular rhythm.     Heart sounds: Normal heart sounds.  Pulmonary:     Effort: Pulmonary effort is normal. No respiratory distress.     Breath sounds: Normal breath sounds.  Abdominal:     Palpations: Abdomen is soft.     Tenderness: There is no abdominal tenderness.  Musculoskeletal:        General: Tenderness present. No swelling, deformity or edema. Normal range of motion.     Cervical back: Neck supple.       Feet:  Skin:    General: Skin is warm and dry.     Capillary Refill: Capillary refill takes less than 2 seconds.     Findings: No bruising, erythema, lesion or rash.  Neurological:     General: No focal deficit present.     Mental Status: He is alert and oriented to person, place, and time.     Sensory: No sensory deficit.     Motor: No weakness.     Gait: Gait normal.  Psychiatric:        Mood and Affect: Mood and affect and mood normal.        Behavior: Behavior normal.      UC Treatments / Results  Labs (all labs ordered are listed, but only abnormal results are displayed) Labs Reviewed - No data to display  EKG   Radiology DG Foot Complete Right  Result Date: 09/19/2020 CLINICAL DATA:  Pt presents with right foot pain xs 1 week. States pain has not subsided over the past week. States went to get up from the floor and over extended his foot. EXAM: RIGHT FOOT COMPLETE - 3+ VIEW COMPARISON:  None. FINDINGS: There is no evidence of fracture or dislocation. There is no evidence of arthropathy or other focal bone abnormality. Soft tissues are unremarkable. IMPRESSION: Negative. Electronically Signed   By: Emmaline Kluver M.D.   On: 09/19/2020 17:17    Procedures Procedures (including critical care  time)  Medications Ordered in UC Medications - No data to display  Initial Impression / Assessment and Plan / UC Course  I have reviewed the triage vital signs and the nursing notes.  Pertinent labs & imaging results that were available during my care of the  patient were reviewed by me and considered in my medical decision making (see chart for details).   Right foot pain.  X-ray negative.  Treating with Tylenol, rest, elevation, ice packs.  Work note provided at patient request.  Instructed patient to follow-up with orthopedics if his symptoms are not improving.  He agrees to plan of care.   Final Clinical Impressions(s) / UC Diagnoses   Final diagnoses:  Foot pain, right     Discharge Instructions     Take Tylenol as needed for discomfort.  Rest and elevate your foot.  Apply ice packs 2-3 times a day for up to 20 minutes each.     Follow up with an orthopedist if your symptoms are not improving.       ED Prescriptions    None     PDMP not reviewed this encounter.   Mickie Bail, NP 09/19/20 1728

## 2020-09-19 NOTE — Discharge Instructions (Signed)
Take Tylenol as needed for discomfort.  Rest and elevate your foot.  Apply ice packs 2-3 times a day for up to 20 minutes each.     Follow up with an orthopedist if your symptoms are not improving.

## 2020-12-08 ENCOUNTER — Emergency Department (HOSPITAL_BASED_OUTPATIENT_CLINIC_OR_DEPARTMENT_OTHER)
Admission: EM | Admit: 2020-12-08 | Discharge: 2020-12-08 | Disposition: A | Discharge status: Home / Self Care | Payer: Medicaid Other | Attending: Emergency Medicine | Admitting: Emergency Medicine

## 2020-12-08 ENCOUNTER — Other Ambulatory Visit: Payer: Self-pay

## 2020-12-08 ENCOUNTER — Encounter (HOSPITAL_BASED_OUTPATIENT_CLINIC_OR_DEPARTMENT_OTHER): Payer: Self-pay

## 2020-12-08 DIAGNOSIS — R112 Nausea with vomiting, unspecified: Secondary | ICD-10-CM

## 2020-12-08 DIAGNOSIS — Z7901 Long term (current) use of anticoagulants: Secondary | ICD-10-CM | POA: Insufficient documentation

## 2020-12-08 DIAGNOSIS — K2901 Acute gastritis with bleeding: Secondary | ICD-10-CM | POA: Insufficient documentation

## 2020-12-08 LAB — URINALYSIS, ROUTINE W REFLEX MICROSCOPIC
Bilirubin Urine: NEGATIVE
Glucose, UA: NEGATIVE mg/dL
Hgb urine dipstick: NEGATIVE
Ketones, ur: 40 mg/dL — AB
Leukocytes,Ua: NEGATIVE
Nitrite: NEGATIVE
Protein, ur: 30 mg/dL — AB
Specific Gravity, Urine: 1.027 (ref 1.005–1.030)
pH: 5.5 (ref 5.0–8.0)

## 2020-12-08 LAB — CBC
HCT: 49 % (ref 39.0–52.0)
Hemoglobin: 16.4 g/dL (ref 13.0–17.0)
MCH: 29.8 pg (ref 26.0–34.0)
MCHC: 33.5 g/dL (ref 30.0–36.0)
MCV: 89.1 fL (ref 80.0–100.0)
Platelets: 252 10*3/uL (ref 150–400)
RBC: 5.5 MIL/uL (ref 4.22–5.81)
RDW: 13.2 % (ref 11.5–15.5)
WBC: 20.3 10*3/uL — ABNORMAL HIGH (ref 4.0–10.5)
nRBC: 0 % (ref 0.0–0.2)

## 2020-12-08 LAB — COMPREHENSIVE METABOLIC PANEL
ALT: 35 U/L (ref 0–44)
AST: 42 U/L — ABNORMAL HIGH (ref 15–41)
Albumin: 5 g/dL (ref 3.5–5.0)
Alkaline Phosphatase: 59 U/L (ref 38–126)
Anion gap: 16 — ABNORMAL HIGH (ref 5–15)
BUN: 19 mg/dL (ref 6–20)
CO2: 26 mmol/L (ref 22–32)
Calcium: 9.6 mg/dL (ref 8.9–10.3)
Chloride: 100 mmol/L (ref 98–111)
Creatinine, Ser: 0.92 mg/dL (ref 0.61–1.24)
GFR, Estimated: >60 mL/min (ref >60)
Glucose, Bld: 129 mg/dL — ABNORMAL HIGH (ref 70–99)
Potassium: 4.2 mmol/L (ref 3.5–5.1)
Sodium: 142 mmol/L (ref 135–145)
Total Bilirubin: 1.4 mg/dL — ABNORMAL HIGH (ref 0.3–1.2)
Total Protein: 8.5 g/dL — ABNORMAL HIGH (ref 6.5–8.1)

## 2020-12-08 LAB — LIPASE, BLOOD: Lipase: 17 U/L (ref 11–51)

## 2020-12-08 MED ORDER — PANTOPRAZOLE SODIUM 20 MG PO TBEC: 40.0000 mg | DELAYED_RELEASE_TABLET | Freq: Every day | ORAL | 0 refills | Status: AC

## 2020-12-08 MED ORDER — ONDANSETRON HCL 4 MG PO TABS: 4.0000 mg | ORAL_TABLET | Freq: Four times a day (QID) | ORAL | 0 refills | Status: DC

## 2020-12-08 MED ORDER — ONDANSETRON HCL 4 MG PO TABS: 4.0000 mg | ORAL_TABLET | Freq: Four times a day (QID) | ORAL | 0 refills | Status: AC

## 2020-12-08 MED ORDER — ONDANSETRON 4 MG PO TBDP
4.0000 mg | ORAL_TABLET | Freq: Once | ORAL | Status: AC | PRN
  Administered 2020-12-08: 4 mg via ORAL
  Filled 2020-12-08: qty 1

## 2020-12-08 NOTE — ED Notes (Signed)
Pt was given water apple juice and ginger ale for fluid challenge. MD requested this EMT to bring them to Pt.

## 2020-12-08 NOTE — ED Provider Notes (Signed)
[facility] EMERGENCY DEPT Provider Note   CSN: [id number] Arrival date & time: 12/08/20  1419     History Chief Complaint  Patient presents with  . Emesis    Aaron Lutz is a 25 y.o. male.  HPI      25 year old male with history of atrial fibrillation presents with concern for abdominal pain, nausea and vomiting. Had etoh last night Off  Balance, disoriented 7AM vomiting, went from green, yellow to brown color. Concerned vomiting feces. Body felt cold, chills Was in afib for a minute, but knows how to get himself out Abdomen hurting in process of throwing up, not having abdominal pain any more Was throwing up, more than 10 times Was feeling weak, but now is feeling somewhat better, difficult caring for son No diarrhea, no constipation Dark brown, like coffee ground emesis No hx of bleeding stomach, no recent ibuprofen No urinary symptoms No fever, earlier this month  Past Medical History:  Diagnosis Date  . Genital herpes   . Nail biting   . PAF (paroxysmal atrial fibrillation) (HCC)   . Pollen allergies     Patient Active Problem List   Diagnosis Date Noted  . URI (upper respiratory infection) 10/30/2016  . AF (paroxysmal atrial fibrillation) (HCC) 05/20/2016  . Throat discomfort 05/20/2016  . Tonsillolith 10/08/2015  . Health care maintenance 01/31/2015  . Systolic murmur 08/27/2014  . Palpitations 07/27/2014  . Lesion of penis 07/27/2014  . Risky sexual behavior 06/09/2013  . Allergic rhinitis 05/17/2013  . Skin irritation 01/08/2012    History reviewed. No pertinent surgical history.     Family History  Problem Relation Age of Onset  . Hypertension Maternal Grandmother   . Diabetes Maternal Grandmother     Social History   Tobacco Use  . Smoking status: Never Smoker  . Smokeless tobacco: Never Used  Vaping Use  . Vaping Use: Never used  Substance Use Topics  . Alcohol use: Not Currently  . Drug use: Yes    Types: Marijuana     Home Medications Prior to Admission medications   Medication Sig Start Date End Date Taking? Authorizing Provider  pantoprazole (PROTONIX) 20 MG tablet Take 2 tablets (40 mg total) by mouth daily for 14 days. 12/08/20 12/22/20 Yes Alvira Monday, MD  benzonatate (TESSALON) 200 MG capsule Take 1 capsule (200 mg total) by mouth 3 (three) times daily as needed for cough. Patient not taking: Reported on 11/16/2017 10/30/16   Marquette Saa, MD  diltiazem (CARDIZEM CD) 120 MG 24 hr capsule Take 1 capsule (120 mg total) by mouth daily. Patient not taking: Reported on 06/03/2016 05/18/16   Leone Brand, NP  loratadine (CLARITIN) 10 MG tablet Take 1 tablet (10 mg total) by mouth daily. Patient not taking: Reported on 04/14/2018 10/30/16   Marquette Saa, MD  ondansetron (ZOFRAN) 4 MG tablet Take 1 tablet (4 mg total) by mouth every 6 (six) hours. 12/08/20   Alvira Monday, MD  rivaroxaban (XARELTO) 20 MG TABS tablet Take 1 tablet (20 mg total) by mouth daily with supper. 08/17/20   Arthor Captain, PA-C    Allergies    Nickel and Pollen extract  Review of Systems   Review of Systems  Constitutional: Negative for fever.  Respiratory: Negative for cough and shortness of breath.   Cardiovascular: Negative for chest pain.  Gastrointestinal: Positive for abdominal pain. Negative for constipation, diarrhea, nausea and vomiting.  Genitourinary: Negative for dysuria.  Neurological: Positive for light-headedness (feeling better now).  Negative for headaches.    Physical Exam Updated Vital Signs BP (!) 144/86   Pulse 85   Temp 97.9 F (36.6 C) (Oral)   Resp 16   Ht 6\' 1"  (1.854 m)   Wt 68 kg   SpO2 100%   BMI 19.79 kg/m   Physical Exam Vitals and nursing note reviewed.  Constitutional:      General: He is not in acute distress.    Appearance: He is well-developed. He is not diaphoretic.  HENT:     Head: Normocephalic and atraumatic.  Eyes:      Conjunctiva/sclera: Conjunctivae normal.  Cardiovascular:     Rate and Rhythm: Normal rate and regular rhythm.     Heart sounds: Normal heart sounds. No murmur heard. No friction rub. No gallop.   Pulmonary:     Effort: Pulmonary effort is normal. No respiratory distress.     Breath sounds: Normal breath sounds. No wheezing or rales.  Abdominal:     General: There is no distension.     Palpations: Abdomen is soft.     Tenderness: There is no abdominal tenderness. There is no guarding.  Musculoskeletal:     Cervical back: Normal range of motion.  Skin:    General: Skin is warm and dry.  Neurological:     Mental Status: He is alert and oriented to person, place, and time.     ED Results / Procedures / Treatments   Labs (all labs ordered are listed, but only abnormal results are displayed) Labs Reviewed  COMPREHENSIVE METABOLIC PANEL - Abnormal; Notable for the following components:      Result Value   Glucose, Bld 129 (*)    Total Protein 8.5 (*)    AST 42 (*)    Total Bilirubin 1.4 (*)    Anion gap 16 (*)    All other components within normal limits  CBC - Abnormal; Notable for the following components:   WBC 20.3 (*)    All other components within normal limits  URINALYSIS, ROUTINE W REFLEX MICROSCOPIC - Abnormal; Notable for the following components:   Ketones, ur 40 (*)    Protein, ur 30 (*)    All other components within normal limits  LIPASE, BLOOD    EKG None  Radiology No results found.  Procedures Procedures   Medications Ordered in ED Medications  ondansetron (ZOFRAN-ODT) disintegrating tablet 4 mg (4 mg Oral Given 12/08/20 1443)    ED Course  I have reviewed the triage vital signs and the nursing notes.  Pertinent labs & imaging results that were available during my care of the patient were reviewed by me and considered in my medical decision making (see chart for details).    MDM Rules/Calculators/A&P                          25 year old male  with history of atrial fibrillation presents with concern for abdominal pain, nausea and vomiting.  Abdominal exam benign at this time, have low suspicion for appendicitis, cholecystitis, perforated viscus, diverticulitis.  No sign of pancreatitis on labs.  No sign of pyelonephritis.  Does report using alcohol last night, and describes emesis consistent with coffee-ground emesis.  Denies having melena, has normal hemoglobin.  Has a leukocytosis, likely stress reaction and do not see signs of acute bacterial infection.  Suspect acute alcoholic gastritis, or other etiology of gastritis.  Symptoms improved with Zofran, and is tolerating p.o.  Lab work does  not lab work and vital signs do not suggest significant dehydration.  Encourage oral rehydration.  Given prescription for Protonix and Zofran. Patient discharged in stable condition with understanding of reasons to return.      Final Clinical Impression(s) / ED Diagnoses Final diagnoses:  Non-intractable vomiting with nausea, unspecified vomiting type  Acute gastritis with hemorrhage, unspecified gastritis type    Rx / DC Orders ED Discharge Orders         Ordered    ondansetron (ZOFRAN) 4 MG tablet  Every 6 hours,   Status:  Discontinued        12/08/20 1927    pantoprazole (PROTONIX) 20 MG tablet  Daily        12/08/20 1927    ondansetron (ZOFRAN) 4 MG tablet  Every 6 hours        12/08/20 Angelena Sole, MD 12/10/20 1141

## 2020-12-08 NOTE — ED Notes (Signed)
Pt verbalizes understanding of discharge instructions. Opportunity for questioning and answers were provided. Armand removed by staff, pt discharged from ED to home. Pt educated to pick up Rx.  

## 2020-12-08 NOTE — ED Triage Notes (Signed)
Pt with emesis that started this AM. Pt states, " I feel like I'm throwing up feces." Pt has associated fatigue.

## 2021-06-16 IMAGING — DX DG CHEST 1V PORT
1 series · 1 of 1 positions shown · non-contrast
Comparison: Chest x-ray dated 05/09/2016

CLINICAL DATA: Atrial fibrillation, irregular heartbeat, shortness
of breath and chest pain since 4 a.m.

EXAM:
PORTABLE CHEST 1 VIEW

[chest ap]
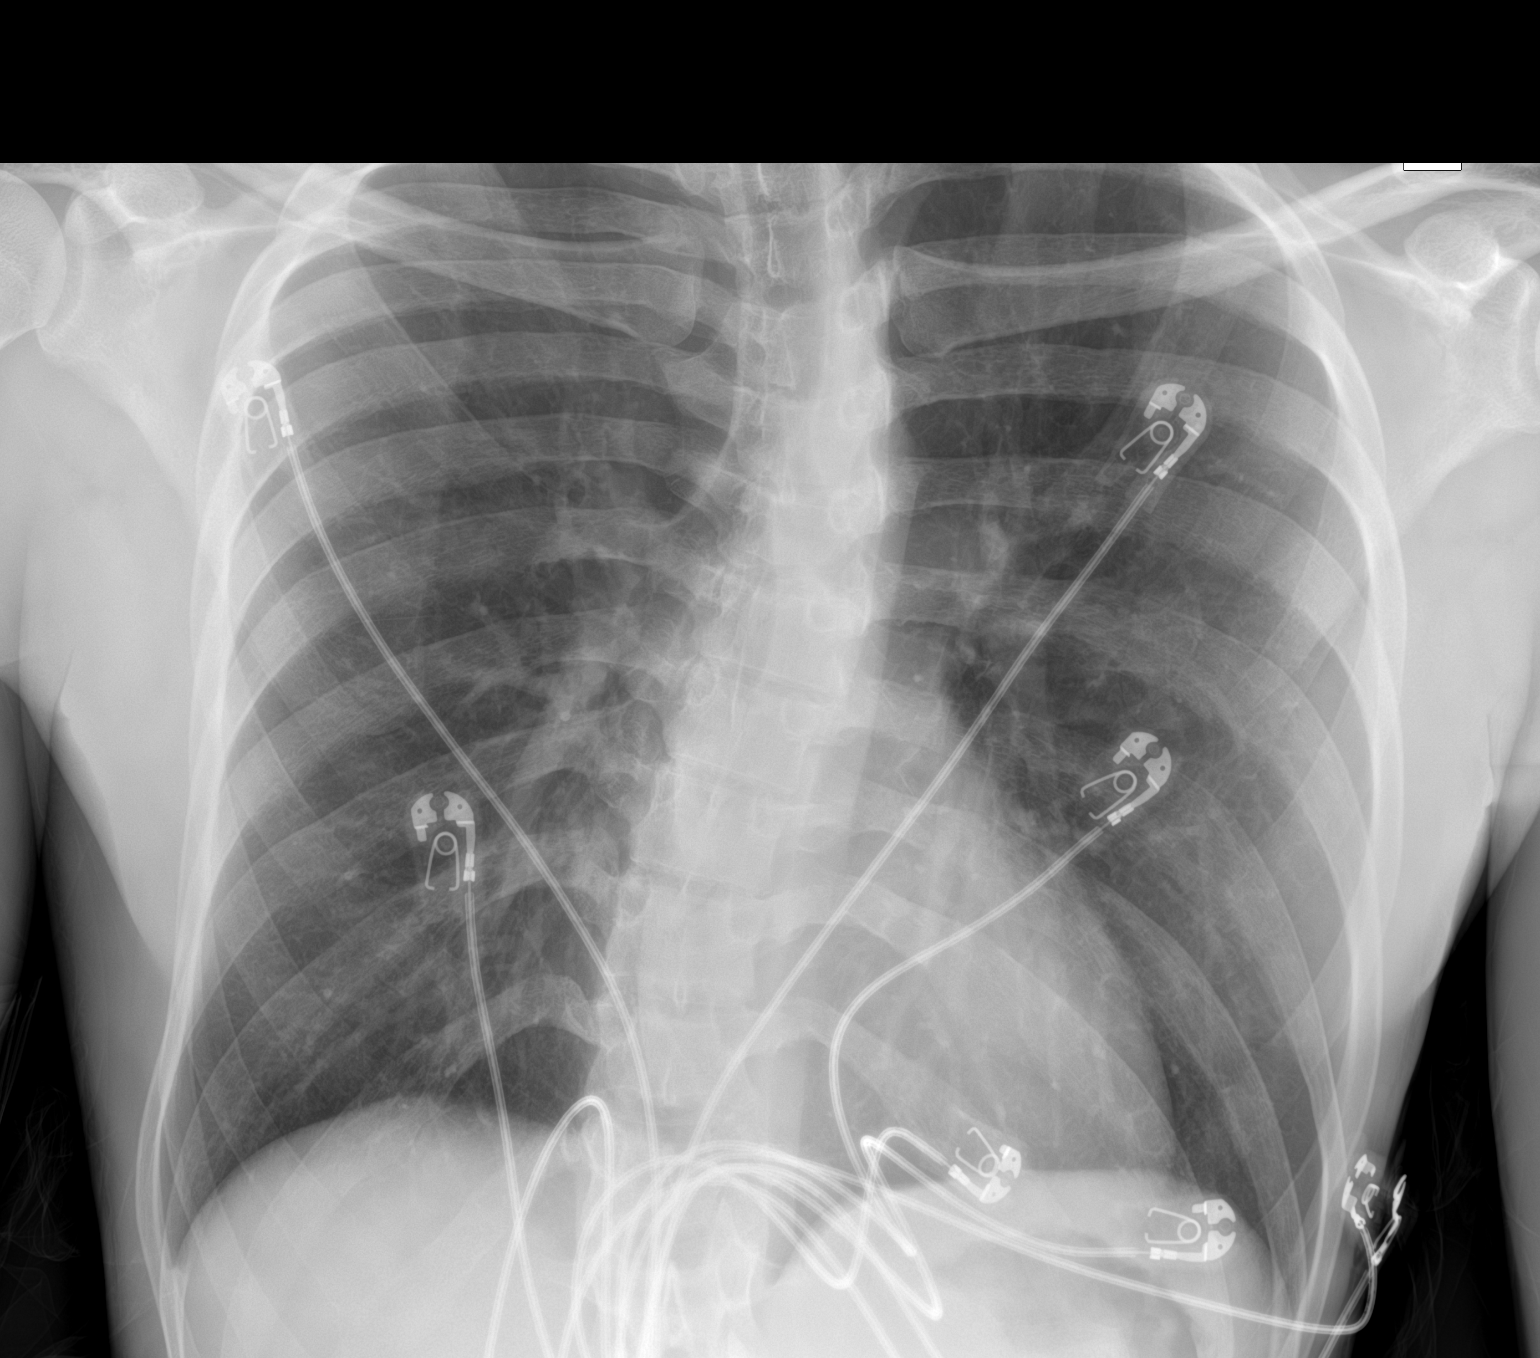

[1 of 1 positions shown; findings below may reference images not displayed]

FINDINGS: Heart size and mediastinal contours are within normal limits. Lungs
are clear. Stable prominent scoliosis of the thoracolumbar spine. No
acute appearing osseous abnormality.
IMPRESSION: No active disease. No evidence of pneumonia or pulmonary edema.

## 2022-06-19 DIAGNOSIS — Z419 Encounter for procedure for purposes other than remedying health state, unspecified: Secondary | ICD-10-CM | POA: Diagnosis not present

## 2022-07-20 DIAGNOSIS — Z419 Encounter for procedure for purposes other than remedying health state, unspecified: Secondary | ICD-10-CM | POA: Diagnosis not present

## 2022-09-03 ENCOUNTER — Telehealth: Payer: Self-pay

## 2022-09-03 NOTE — Telephone Encounter (Signed)
Mychart msg sent. AS, CMA

## 2023-06-15 ENCOUNTER — Telehealth (HOSPITAL_COMMUNITY): Payer: Self-pay

## 2023-06-15 ENCOUNTER — Ambulatory Visit (HOSPITAL_COMMUNITY)
Admission: EM | Admit: 2023-06-15 | Discharge: 2023-06-15 | Disposition: A | Discharge status: Home / Self Care | Payer: Medicaid Other | Attending: Internal Medicine | Admitting: Internal Medicine

## 2023-06-15 ENCOUNTER — Encounter (HOSPITAL_COMMUNITY): Payer: Self-pay

## 2023-06-15 DIAGNOSIS — H65192 Other acute nonsuppurative otitis media, left ear: Secondary | ICD-10-CM | POA: Diagnosis not present

## 2023-06-15 MED ORDER — AMOXICILLIN 875 MG PO TABS: 875.0000 mg | ORAL_TABLET | Freq: Two times a day (BID) | ORAL | 0 refills | Status: AC

## 2023-06-15 MED ORDER — AMOXICILLIN 875 MG PO TABS: 875.0000 mg | ORAL_TABLET | Freq: Two times a day (BID) | ORAL | 0 refills | Status: DC

## 2023-06-15 NOTE — ED Provider Notes (Signed)
[facility]    CSN: [id number] Arrival date & time: 06/15/23  1426      History   Chief Complaint Chief Complaint  Patient presents with   Ear Pain    HPI Aaron Lutz is a 27 y.o. male.   Patient presents with left ear pain that started today.  Reports that he has a had a little bit of upper respiratory symptoms.  Denies any associated fever.  He has not taken any medication for pain.     Past Medical History:  Diagnosis Date   Genital herpes    Nail biting    PAF (paroxysmal atrial fibrillation) (HCC)    Pollen allergies     Patient Active Problem List   Diagnosis Date Noted   URI (upper respiratory infection) 10/30/2016   AF (paroxysmal atrial fibrillation) (HCC) 05/20/2016   Throat discomfort 05/20/2016   Tonsillolith 10/08/2015   Health care maintenance 01/31/2015   Systolic murmur 08/27/2014   Palpitations 07/27/2014   Lesion of penis 07/27/2014   Risky sexual behavior 06/09/2013   Allergic rhinitis 05/17/2013   Skin irritation 01/08/2012    History reviewed. No pertinent surgical history.     Home Medications    Prior to Admission medications   Medication Sig Start Date End Date Taking? Authorizing Provider  amoxicillin (AMOXIL) 875 MG tablet Take 1 tablet (875 mg total) by mouth 2 (two) times daily for 7 days. 06/15/23 06/22/23 Yes Dajuana Palen, Acie Fredrickson, FNP  benzonatate (TESSALON) 200 MG capsule Take 1 capsule (200 mg total) by mouth 3 (three) times daily as needed for cough. Patient not taking: Reported on 11/16/2017 10/30/16   Marquette Saa, MD  diltiazem (CARDIZEM CD) 120 MG 24 hr capsule Take 1 capsule (120 mg total) by mouth daily. Patient not taking: Reported on 06/03/2016 05/18/16   Leone Brand, NP  loratadine (CLARITIN) 10 MG tablet Take 1 tablet (10 mg total) by mouth daily. Patient not taking: Reported on 04/14/2018 10/30/16   Marquette Saa, MD  ondansetron (ZOFRAN) 4 MG tablet Take 1 tablet (4 mg total) by  mouth every 6 (six) hours. 12/08/20   Alvira Monday, MD  pantoprazole (PROTONIX) 20 MG tablet Take 2 tablets (40 mg total) by mouth daily for 14 days. 12/08/20 12/22/20  Alvira Monday, MD  rivaroxaban (XARELTO) 20 MG TABS tablet Take 1 tablet (20 mg total) by mouth daily with supper. 08/17/20   Arthor Captain, PA-C    Family History Family History  Problem Relation Age of Onset   Hypertension Maternal Grandmother    Diabetes Maternal Grandmother     Social History Social History   Tobacco Use   Smoking status: Never   Smokeless tobacco: Never  Vaping Use   Vaping status: Never Used  Substance Use Topics   Alcohol use: Not Currently   Drug use: Yes    Types: Marijuana     Allergies   Nickel and Pollen extract   Review of Systems Review of Systems Per HPI  Physical Exam Triage Vital Signs ED Triage Vitals [06/15/23 1512]  Encounter Vitals Group     BP 125/89     Systolic BP Percentile      Diastolic BP Percentile      Pulse Rate 89     Resp 16     Temp 98.6 F (37 C)     Temp Source Oral     SpO2 97 %     Weight      Height  Head Circumference      Peak Flow      Pain Score 10     Pain Loc      Pain Education      Exclude from Growth Chart    No data found.  Updated Vital Signs BP 125/89 (BP Location: Left Arm)   Pulse 89   Temp 98.6 F (37 C) (Oral)   Resp 16   SpO2 97%   Visual Acuity Right Eye Distance:   Left Eye Distance:   Bilateral Distance:    Right Eye Near:   Left Eye Near:    Bilateral Near:     Physical Exam Constitutional:      General: He is not in acute distress.    Appearance: Normal appearance. He is not toxic-appearing or diaphoretic.  HENT:     Head: Normocephalic and atraumatic.     Left Ear: External ear normal. No drainage, swelling or tenderness.  No middle ear effusion. There is no impacted cerumen. No foreign body. No mastoid tenderness. Tympanic membrane is erythematous. Tympanic membrane is not perforated  or bulging.     Ears:     Comments: Patient had cerumen blocking visualization of left TM so this was irrigated with successful removal.  On second physical exam, TM is dull and erythematous. Eyes:     Extraocular Movements: Extraocular movements intact.     Conjunctiva/sclera: Conjunctivae normal.  Pulmonary:     Effort: Pulmonary effort is normal.  Neurological:     General: No focal deficit present.     Mental Status: He is alert and oriented to person, place, and time. Mental status is at baseline.  Psychiatric:        Mood and Affect: Mood normal.        Behavior: Behavior normal.        Thought Content: Thought content normal.        Judgment: Judgment normal.      UC Treatments / Results  Labs (all labs ordered are listed, but only abnormal results are displayed) Labs Reviewed - No data to display  EKG   Radiology No results found.  Procedures Procedures (including critical care time)  Medications Ordered in UC Medications - No data to display  Initial Impression / Assessment and Plan / UC Course  I have reviewed the triage vital signs and the nursing notes.  Pertinent labs & imaging results that were available during my care of the patient were reviewed by me and considered in my medical decision making (see chart for details).     Patient has left otitis media.  Will treat with amoxicillin antibiotic.  Cerumen was removed by ear irrigation to visualize the TM with success.  Advised strict follow-up precautions.  Patient verbalized understanding and was agreeable with plan. Final Clinical Impressions(s) / UC Diagnoses   Final diagnoses:  Other non-recurrent acute nonsuppurative otitis media of left ear     Discharge Instructions      You have an ear infection so I have prescribed an antibiotic to treat this.  Please follow-up if any symptoms persist or worsen.     ED Prescriptions     Medication Sig Dispense Auth. Provider   amoxicillin (AMOXIL)  875 MG tablet Take 1 tablet (875 mg total) by mouth 2 (two) times daily for 7 days. 14 tablet [place], Acie Fredrickson, [place]      PDMP not reviewed this encounter.   Gustavus Bryant, [place] 06/15/23 [phone]

## 2023-06-15 NOTE — ED Triage Notes (Signed)
Here for left ear pain that started today. Pt has not taking any medication to help.

## 2023-06-15 NOTE — Discharge Instructions (Signed)
You have an ear infection so I have prescribed an antibiotic to treat this.  Please follow-up if any symptoms persist or worsen.

## 2023-06-15 NOTE — ED Notes (Signed)
Notified Rolly Salter, NP about progress with irrigating left ear

## 2023-10-12 ENCOUNTER — Encounter (HOSPITAL_BASED_OUTPATIENT_CLINIC_OR_DEPARTMENT_OTHER): Payer: Self-pay

## 2023-10-12 ENCOUNTER — Other Ambulatory Visit: Payer: Self-pay

## 2023-10-12 ENCOUNTER — Emergency Department (HOSPITAL_BASED_OUTPATIENT_CLINIC_OR_DEPARTMENT_OTHER)
Admission: EM | Admit: 2023-10-12 | Discharge: 2023-10-12 | Disposition: A | Discharge status: Home / Self Care | Attending: Emergency Medicine | Admitting: Emergency Medicine

## 2023-10-12 DIAGNOSIS — R112 Nausea with vomiting, unspecified: Secondary | ICD-10-CM | POA: Diagnosis not present

## 2023-10-12 DIAGNOSIS — R197 Diarrhea, unspecified: Secondary | ICD-10-CM | POA: Diagnosis not present

## 2023-10-12 DIAGNOSIS — R1110 Vomiting, unspecified: Secondary | ICD-10-CM | POA: Diagnosis present

## 2023-10-12 DIAGNOSIS — Z7901 Long term (current) use of anticoagulants: Secondary | ICD-10-CM | POA: Diagnosis not present

## 2023-10-12 LAB — CBC
HCT: 46.1 % (ref 39.0–52.0)
Hemoglobin: 16.2 g/dL (ref 13.0–17.0)
MCH: 31.6 pg (ref 26.0–34.0)
MCHC: 35.1 g/dL (ref 30.0–36.0)
MCV: 90 fL (ref 80.0–100.0)
Platelets: 284 10*3/uL (ref 150–400)
RBC: 5.12 MIL/uL (ref 4.22–5.81)
RDW: 12.8 % (ref 11.5–15.5)
WBC: 10.3 10*3/uL (ref 4.0–10.5)
nRBC: 0 % (ref 0.0–0.2)

## 2023-10-12 LAB — COMPREHENSIVE METABOLIC PANEL
ALT: 15 U/L (ref 0–44)
AST: 34 U/L (ref 15–41)
Albumin: 4.8 g/dL (ref 3.5–5.0)
Alkaline Phosphatase: 55 U/L (ref 38–126)
Anion gap: 12 (ref 5–15)
BUN: 14 mg/dL (ref 6–20)
CO2: 25 mmol/L (ref 22–32)
Calcium: 9.4 mg/dL (ref 8.9–10.3)
Chloride: 104 mmol/L (ref 98–111)
Creatinine, Ser: 0.81 mg/dL (ref 0.61–1.24)
GFR, Estimated: >60 mL/min (ref >60)
Glucose, Bld: 81 mg/dL (ref 70–99)
Potassium: 4.3 mmol/L (ref 3.5–5.1)
Sodium: 141 mmol/L (ref 135–145)
Total Bilirubin: 1.3 mg/dL — ABNORMAL HIGH (ref 0.0–1.2)
Total Protein: 7.2 g/dL (ref 6.5–8.1)

## 2023-10-12 LAB — LIPASE, BLOOD: Lipase: 14 U/L (ref 11–51)

## 2023-10-12 MED ORDER — ONDANSETRON 4 MG PO TBDP: 4.0000 mg | ORAL_TABLET | Freq: Three times a day (TID) | ORAL | 0 refills | Status: AC | PRN

## 2023-10-12 MED ORDER — ONDANSETRON 4 MG PO TBDP
4.0000 mg | ORAL_TABLET | Freq: Once | ORAL | Status: AC
  Administered 2023-10-12: 4 mg via ORAL
  Filled 2023-10-12: qty 1

## 2023-10-12 NOTE — ED Provider Notes (Signed)
 [place] EMERGENCY DEPARTMENT AT [facility] Provider Note   CSN: [id number] Arrival date & time: 10/12/23  1503     History  Chief Complaint  Patient presents with   Emesis    Aaron Lutz is a 28 y.o. male.  Patient to ED with vomiting multiple times since this morning and reporting 1-2 episodes diarrhea. No fever or significant abdominal pain. No symptoms yesterday. He denies known sick contacts.   The history is provided by the patient. No language interpreter was used.  Emesis      Home Medications Prior to Admission medications   Medication Sig Start Date End Date Taking? Authorizing Provider  ondansetron (ZOFRAN-ODT) 4 MG disintegrating tablet Take 1 tablet (4 mg total) by mouth every 8 (eight) hours as needed for nausea or vomiting. 10/12/23  Yes Braxtin Bamba, PA-C  benzonatate (TESSALON) 200 MG capsule Take 1 capsule (200 mg total) by mouth 3 (three) times daily as needed for cough. Patient not taking: Reported on 11/16/2017 10/30/16   Marquette Saa, MD  diltiazem (CARDIZEM CD) 120 MG 24 hr capsule Take 1 capsule (120 mg total) by mouth daily. Patient not taking: Reported on 06/03/2016 05/18/16   Leone Brand, NP  loratadine (CLARITIN) 10 MG tablet Take 1 tablet (10 mg total) by mouth daily. Patient not taking: Reported on 04/14/2018 10/30/16   Marquette Saa, MD  ondansetron (ZOFRAN) 4 MG tablet Take 1 tablet (4 mg total) by mouth every 6 (six) hours. 12/08/20   Alvira Monday, MD  pantoprazole (PROTONIX) 20 MG tablet Take 2 tablets (40 mg total) by mouth daily for 14 days. 12/08/20 12/22/20  Alvira Monday, MD  rivaroxaban (XARELTO) 20 MG TABS tablet Take 1 tablet (20 mg total) by mouth daily with supper. 08/17/20   Arthor Captain, PA-C      Allergies    Nickel and Pollen extract    Review of Systems   Review of Systems  Gastrointestinal:  Positive for vomiting.    Physical Exam Updated Vital Signs BP 134/85 (BP Location:  Left Arm)   Pulse 70   Temp 98 F (36.7 C) (Oral)   Resp 16   Ht 6\' 1"  (1.854 m)   Wt 68 kg   SpO2 100%   BMI 19.78 kg/m  Physical Exam Constitutional:      Appearance: He is well-developed.  Cardiovascular:     Rate and Rhythm: Normal rate.  Pulmonary:     Effort: Pulmonary effort is normal.  Abdominal:     General: Bowel sounds are normal. There is no distension.     Palpations: Abdomen is soft.     Tenderness: There is no abdominal tenderness.  Musculoskeletal:        General: Normal range of motion.     Cervical back: Normal range of motion.  Skin:    General: Skin is warm and dry.  Neurological:     Mental Status: He is alert and oriented to person, place, and time.     ED Results / Procedures / Treatments   Labs (all labs ordered are listed, but only abnormal results are displayed) Labs Reviewed  COMPREHENSIVE METABOLIC PANEL - Abnormal; Notable for the following components:      Result Value   Total Bilirubin 1.3 (*)    All other components within normal limits  LIPASE, BLOOD  CBC  URINALYSIS, ROUTINE W REFLEX MICROSCOPIC    EKG None  Radiology No results found.  Procedures Procedures    Medications  Ordered in ED Medications  ondansetron (ZOFRAN-ODT) disintegrating tablet 4 mg (4 mg Oral Given 10/12/23 1653)    ED Course/ Medical Decision Making/ A&P Clinical Course as of 10/12/23 1725  Tue Oct 12, 2023  1643 Patient with vomiting and diarrhea today. Well appearing. No vomiting in ED. VSS. Labs unremarkable. Zofran provided. Will PO challenge. Anticipate discharge home with supportive for likely viral process.  [SU]  1723 Tolerating PO fluids, snack. Feels much better with Zofran. Appropriate for discharge home.  [SU]    Clinical Course User Index [SU] Elpidio Anis, PA-C                                 Medical Decision Making Amount and/or Complexity of Data Reviewed Labs: ordered.  Risk Prescription drug  management.           Final Clinical Impression(s) / ED Diagnoses Final diagnoses:  Nausea and vomiting, unspecified vomiting type    Rx / DC Orders ED Discharge Orders          Ordered    ondansetron (ZOFRAN-ODT) 4 MG disintegrating tablet  Every 8 hours PRN        10/12/23 1724              Elpidio Anis, PA-C 10/12/23 1725    Melene Plan, DO 10/12/23 1832

## 2023-10-12 NOTE — Discharge Instructions (Signed)
 Use Zofran every 8 hours as needed to control nausea. Push fluids and advance diet as tolerated.

## 2023-10-12 NOTE — ED Triage Notes (Signed)
 Onset today of vomiting.  Generalized abd. Pain.  Diarrhea.Appears NAD
